# Patient Record
Sex: Male | Born: 1970 | Hispanic: No | Marital: Married | Smoking: Never smoker
Health system: Southern US, Community
[De-identification: ages and names within clinical notes are randomized; demographics above are authoritative.]

## PROBLEM LIST (undated history)

## (undated) DIAGNOSIS — D563 Thalassemia minor: Secondary | ICD-10-CM

## (undated) DIAGNOSIS — Z87442 Personal history of urinary calculi: Secondary | ICD-10-CM

## (undated) DIAGNOSIS — R55 Syncope and collapse: Secondary | ICD-10-CM

---

## 2017-01-03 ENCOUNTER — Encounter: Payer: Self-pay | Admitting: Radiology

## 2017-01-03 ENCOUNTER — Emergency Department
Admission: EM | Admit: 2017-01-03 | Discharge: 2017-01-03 | Disposition: A | Discharge status: Home / Self Care | Payer: 59 | Attending: Emergency Medicine | Admitting: Emergency Medicine

## 2017-01-03 ENCOUNTER — Emergency Department: Payer: 59

## 2017-01-03 ENCOUNTER — Other Ambulatory Visit: Payer: Self-pay

## 2017-01-03 DIAGNOSIS — R1033 Periumbilical pain: Secondary | ICD-10-CM | POA: Insufficient documentation

## 2017-01-03 DIAGNOSIS — N200 Calculus of kidney: Secondary | ICD-10-CM | POA: Insufficient documentation

## 2017-01-03 DIAGNOSIS — R109 Unspecified abdominal pain: Secondary | ICD-10-CM | POA: Diagnosis not present

## 2017-01-03 HISTORY — DX: Thalassemia minor: D56.3

## 2017-01-03 LAB — URINALYSIS, COMPLETE (UACMP) WITH MICROSCOPIC
Bacteria, UA: NONE SEEN
Bilirubin Urine: NEGATIVE
Glucose, UA: NEGATIVE mg/dL
Ketones, ur: NEGATIVE mg/dL
Leukocytes, UA: NEGATIVE
Nitrite: NEGATIVE
Protein, ur: NEGATIVE mg/dL
Specific Gravity, Urine: 1.031 — ABNORMAL HIGH (ref 1.005–1.030)
Squamous Epithelial / LPF: NONE SEEN
pH: 5 (ref 5.0–8.0)

## 2017-01-03 LAB — COMPREHENSIVE METABOLIC PANEL
ALT: 21 U/L (ref 17–63)
AST: 26 U/L (ref 15–41)
Albumin: 4.5 g/dL (ref 3.5–5.0)
Alkaline Phosphatase: 55 U/L (ref 38–126)
Anion gap: 11 (ref 5–15)
BUN: 14 mg/dL (ref 6–20)
CO2: 26 mmol/L (ref 22–32)
Calcium: 9.6 mg/dL (ref 8.9–10.3)
Chloride: 101 mmol/L (ref 101–111)
Creatinine, Ser: 0.84 mg/dL (ref 0.61–1.24)
GFR calc Af Amer: >60 mL/min (ref >60)
GFR calc non Af Amer: >60 mL/min (ref >60)
Glucose, Bld: 110 mg/dL — ABNORMAL HIGH (ref 65–99)
Potassium: 3.7 mmol/L (ref 3.5–5.1)
Sodium: 138 mmol/L (ref 135–145)
Total Bilirubin: 0.9 mg/dL (ref 0.3–1.2)
Total Protein: 7.7 g/dL (ref 6.5–8.1)

## 2017-01-03 LAB — CBC
HCT: 36.2 % — ABNORMAL LOW (ref 40.0–52.0)
Hemoglobin: 11.5 g/dL — ABNORMAL LOW (ref 13.0–18.0)
MCH: 19.8 pg — ABNORMAL LOW (ref 26.0–34.0)
MCHC: 31.8 g/dL — ABNORMAL LOW (ref 32.0–36.0)
MCV: 62.1 fL — ABNORMAL LOW (ref 80.0–100.0)
Platelets: 214 10*3/uL (ref 150–440)
RBC: 5.83 MIL/uL (ref 4.40–5.90)
RDW: 15.6 % — ABNORMAL HIGH (ref 11.5–14.5)
WBC: 8.6 10*3/uL (ref 3.8–10.6)

## 2017-01-03 LAB — LIPASE, BLOOD: Lipase: 28 U/L (ref 11–51)

## 2017-01-03 MED ORDER — KETOROLAC TROMETHAMINE 30 MG/ML IJ SOLN
30.0000 mg | Freq: Once | INTRAMUSCULAR | Status: AC
  Administered 2017-01-03: 30 mg via INTRAVENOUS
  Filled 2017-01-03: qty 1

## 2017-01-03 MED ORDER — ONDANSETRON HCL 4 MG/2ML IJ SOLN
4.0000 mg | Freq: Once | INTRAMUSCULAR | Status: AC
  Administered 2017-01-03: 4 mg via INTRAVENOUS
  Filled 2017-01-03: qty 2

## 2017-01-03 MED ORDER — KETOROLAC TROMETHAMINE 10 MG PO TABS: 10.0000 mg | ORAL_TABLET | Freq: Four times a day (QID) | ORAL | 0 refills | Status: DC | PRN

## 2017-01-03 MED ORDER — IOPAMIDOL (ISOVUE-300) INJECTION 61%
100.0000 mL | Freq: Once | INTRAVENOUS | Status: AC | PRN
  Administered 2017-01-03: 100 mL via INTRAVENOUS

## 2017-01-03 MED ORDER — OXYCODONE-ACETAMINOPHEN 5-325 MG PO TABS: 1.0000 | ORAL_TABLET | Freq: Four times a day (QID) | ORAL | 0 refills | Status: DC | PRN

## 2017-01-03 MED ORDER — SODIUM CHLORIDE 0.9 % IV BOLUS (SEPSIS)
1000.0000 mL | Freq: Once | INTRAVENOUS | Status: AC
Start: 2017-01-03 — End: 2017-01-03
  Administered 2017-01-03: 1000 mL via INTRAVENOUS

## 2017-01-03 MED ORDER — IOPAMIDOL (ISOVUE-300) INJECTION 61%: 30.0000 mL | Freq: Once | INTRAVENOUS | Status: DC

## 2017-01-03 MED ORDER — TAMSULOSIN HCL 0.4 MG PO CAPS: 0.4000 mg | ORAL_CAPSULE | Freq: Every day | ORAL | 0 refills | Status: DC

## 2017-01-03 MED ORDER — MORPHINE SULFATE (PF) 4 MG/ML IV SOLN
4.0000 mg | Freq: Once | INTRAVENOUS | Status: AC
  Administered 2017-01-03: 4 mg via INTRAVENOUS
  Filled 2017-01-03: qty 1

## 2017-01-03 MED ORDER — ONDANSETRON 4 MG PO TBDP: 4.0000 mg | ORAL_TABLET | Freq: Three times a day (TID) | ORAL | 0 refills | Status: DC | PRN

## 2017-01-03 NOTE — ED Triage Notes (Signed)
Patient reports mid abdominal pain that goes into his back that started tonight.  + nausea.

## 2017-01-03 NOTE — ED Notes (Signed)
Patient reports pain that started at approx 2130. Denies any diarrhea or fevers. Had small bowel movement tonight that did not seem to improve pain.

## 2017-01-03 NOTE — Discharge Instructions (Signed)
Your evaluation revealed a right sided kidney stone. Please ensure that your are drinking sufficient amounts of water and please take your medication should the pain return. Please follow up with urology should you still have discomfort as yo may need a lithotripsy if the stone does not pass. Please also return should you develop fever, chills, nausea and vomiting, pain with urination or pain that is uncontrolled by your medication.

## 2017-01-03 NOTE — ED Provider Notes (Signed)
[facility] Emergency Department Provider Note   ____________________________________________   First MD Initiated Contact with Patient 01/03/17 0118     (approximate)  I have reviewed the triage vital signs and the nursing notes.   HISTORY  Chief Complaint Abdominal Pain    HPI Joshua RODGERS is a 46 y.o. male who comes into the hospital today with abdominal pain going to his back.  The patient states that the pain started around 9:00 initially.  It improved but then after he went to sleep but woke him up out of his sleep.  The patient has had some nausea with no vomiting.  He has had some bowel spasms in the past but reports that this is a bit different.  He reports that his pain is more intense and going into his low back.  The patient denies any diarrhea or problems with his urination.  He has had no pain when he urinates or hematuria.  The patient took 2 Tylenol but it did not help with his pain.  He rates his pain a 9 out of 10 in intensity.  The patient came into the hospital for evaluation.    Past Medical History:  Diagnosis Date  . Thalassemia minor     There are no active problems to display for this patient.   History reviewed. No pertinent surgical history.  Prior to Admission medications   Medication Sig Start Date End Date Taking? Authorizing Provider  ketorolac (TORADOL) 10 MG tablet Take 1 tablet (10 mg total) every 6 (six) hours as needed by mouth. 01/03/17   Loney Hering, MD  ondansetron (ZOFRAN ODT) 4 MG disintegrating tablet Take 1 tablet (4 mg total) every 8 (eight) hours as needed by mouth for nausea or vomiting. 01/03/17   Loney Hering, MD  oxyCODONE-acetaminophen (ROXICET) 5-325 MG tablet Take 1 tablet every 6 (six) hours as needed by mouth. 01/03/17   Loney Hering, MD  tamsulosin [facility]) 0.4 MG CAPS capsule Take 1 capsule (0.4 mg total) daily by mouth. 01/03/17   Loney Hering, MD     Allergies Patient has no known allergies.  No family history on file.  Social History Social History   Tobacco Use  . Smoking status: Never Smoker  Substance Use Topics  . Alcohol use: No    Frequency: Never  . Drug use: Not on file    Review of Systems  Constitutional: No fever/chills Eyes: No visual changes. ENT: No sore throat. Cardiovascular: Denies chest pain. Respiratory: Denies shortness of breath. Gastrointestinal: abdominal pain.   nausea, no vomiting.  No diarrhea.  No constipation. Genitourinary: Negative for dysuria. Musculoskeletal:  back pain. Skin: Negative for rash. Neurological: Negative for headaches, focal weakness or numbness.   ____________________________________________   PHYSICAL EXAM:  VITAL SIGNS: ED Triage Vitals  Enc Vitals Group     BP 01/03/17 0116 140/78     Pulse Rate 01/03/17 0116 61     Resp 01/03/17 0116 20     Temp 01/03/17 0116 97.7 F (36.5 C)     Temp Source 01/03/17 0116 Oral     SpO2 01/03/17 0116 99 %     Weight 01/03/17 0115 192 lb (87.1 kg)     Height 01/03/17 0115 6' (1.829 m)     Head Circumference --      Peak Flow --      Pain Score 01/03/17 0114 9     Pain Loc --  Pain Edu? --      Excl. in [place]? --     Constitutional: Alert and oriented. Well appearing and in moderate distress. Eyes: Conjunctivae are normal. PERRL. EOMI. Head: Atraumatic. Nose: No congestion/rhinnorhea. Mouth/Throat: Mucous membranes are moist.  Oropharynx non-erythematous. Cardiovascular: Normal rate, regular rhythm. Grossly normal heart sounds.  Good peripheral circulation. Respiratory: Normal respiratory effort.  No retractions. Lungs CTAB. Gastrointestinal: Soft with some periumbilical and suprapubic tenderness to palpation, no right upper quadrant tenderness to palpation. No distention.  Positive bowel sounds No CVA tenderness to palpation Musculoskeletal: No lower extremity tenderness nor edema.   Neurologic:  Normal speech  and language.  Skin:  Skin is warm, dry and intact.  Psychiatric: Mood and affect are normal.   ____________________________________________   LABS (all labs ordered are listed, but only abnormal results are displayed)  Labs Reviewed  CBC - Abnormal; Notable for the following components:      Result Value   Hemoglobin 11.5 (*)    HCT 36.2 (*)    MCV 62.1 (*)    MCH 19.8 (*)    MCHC 31.8 (*)    RDW 15.6 (*)    All other components within normal limits  COMPREHENSIVE METABOLIC PANEL - Abnormal; Notable for the following components:   Glucose, Bld 110 (*)    All other components within normal limits  URINALYSIS, COMPLETE (UACMP) WITH MICROSCOPIC - Abnormal; Notable for the following components:   Color, Urine STRAW (*)    APPearance CLEAR (*)    Specific Gravity, Urine 1.031 (*)    Hgb urine dipstick MODERATE (*)    All other components within normal limits  LIPASE, BLOOD   ____________________________________________  EKG  none ____________________________________________  RADIOLOGY  Ct Abdomen Pelvis W Contrast  Result Date: 01/03/2017 CLINICAL DATA:  Acute onset of mid abdominal pain, radiating to the back. Nausea. EXAM: CT ABDOMEN AND PELVIS WITH CONTRAST TECHNIQUE: Multidetector CT imaging of the abdomen and pelvis was performed using the standard protocol following bolus administration of intravenous contrast. CONTRAST:  142mL ISOVUE-300 IOPAMIDOL (ISOVUE-300) INJECTION 61% COMPARISON:  None. FINDINGS: Lower chest: Minimal nodular atelectasis is noted at the right lung base. The visualized portions of the mediastinum are unremarkable. Hepatobiliary: The liver is unremarkable in appearance. The gallbladder is unremarkable in appearance. The common bile duct remains normal in caliber. Pancreas: The pancreas is within normal limits. Spleen: The spleen is unremarkable in appearance. Adrenals/Urinary Tract: There is slightly decreased enhancement of the right kidney, with  minimal right-sided hydronephrosis. This reflects an obstructing 5 x 4 mm stone at the proximal right ureter, just below the right ureteropelvic junction. The left kidney is unremarkable in appearance. No nonobstructing renal stones are identified. Stomach/Bowel: The stomach is unremarkable in appearance. The small bowel is within normal limits. The appendix is normal in caliber, without evidence of appendicitis. The colon is unremarkable in appearance. Vascular/Lymphatic: The abdominal aorta is unremarkable in appearance. The inferior vena cava is grossly unremarkable. No retroperitoneal lymphadenopathy is seen. No pelvic sidewall lymphadenopathy is identified. Reproductive: The bladder is mildly distended and grossly unremarkable. The prostate is enlarged, measuring 5.3 cm in transverse dimension. Other: No additional soft tissue abnormalities are seen. Musculoskeletal: No acute osseous abnormalities are identified. The visualized musculature is unremarkable in appearance. IMPRESSION: 1. Minimal right-sided hydronephrosis, with an obstructing 5 x 4 mm stone at the proximal right ureter, just below the right ureteropelvic junction. 2. Enlarged prostate noted. Electronically Signed   By: Francoise Schaumann.D.  On: 01/03/2017 02:38    ____________________________________________   PROCEDURES  Procedure(s) performed: None  Procedures  Critical Care performed: No  ____________________________________________   INITIAL IMPRESSION / ASSESSMENT AND PLAN / ED COURSE  As part of my medical decision making, I reviewed the following data within the electronic MEDICAL RECORD NUMBER Notes from prior ED visits and [place] Controlled Substance Database   This is a 46 year old male who comes into the hospital today with some mid abdominal pain that goes into his back.  My differential diagnosis includes pancreatitis, gastritis, biliary disease, urinary tract infection, appendicitis  I did check some blood work and  the patient's blood work was unremarkable.  His hemoglobin is appropriate for his thalassemia minor.  I sent the patient also for a CT scan to evaluate the multiple causes of the patient's pain.  He did receive a dose of morphine as well as some Zofran.  The patient CT scan resulted with some minimal right-sided hydronephrosis and an obstructing 5 x 4 mm stone at the proximal right UPJ.  The patient's prostate was also found to be enlarged.  After the pain medication the patient was still uncomfortable so he received a dose of Toradol.  He also received a liter of normal saline and his pain improved significantly.  The patient's urine does not show any signs of infection.  After the medication was received the patient decided that he was ready to be discharged home.  The patient should follow back up with urology for further evaluation and possible lithotripsy.  The patient will be discharged home.  He had no further questions.      ____________________________________________   FINAL CLINICAL IMPRESSION(S) / ED DIAGNOSES  Final diagnoses:  Periumbilical abdominal pain  Kidney stone     ED Discharge Orders        Ordered    ketorolac (TORADOL) 10 MG tablet  Every 6 hours PRN     01/03/17 0322    oxyCODONE-acetaminophen (ROXICET) 5-325 MG tablet  Every 6 hours PRN     01/03/17 0322    tamsulosin (FLOMAX) 0.4 MG CAPS capsule  Daily     01/03/17 0322    ondansetron (ZOFRAN ODT) 4 MG disintegrating tablet  Every 8 hours PRN     01/03/17 0322       Note:  This document was prepared using Dragon voice recognition software and may include unintentional dictation errors.    Loney Hering, MD 01/03/17 [phone]

## 2017-01-04 ENCOUNTER — Ambulatory Visit
Admission: RE | Admit: 2017-01-04 | Discharge: 2017-01-04 | Disposition: A | Discharge status: Home / Self Care | Payer: 59 | Source: Ambulatory Visit | Attending: Urology | Admitting: Urology

## 2017-01-04 ENCOUNTER — Other Ambulatory Visit: Payer: Self-pay | Admitting: Radiology

## 2017-01-04 ENCOUNTER — Encounter: Payer: Self-pay | Admitting: Urology

## 2017-01-04 ENCOUNTER — Ambulatory Visit (INDEPENDENT_AMBULATORY_CARE_PROVIDER_SITE_OTHER): Payer: 59 | Admitting: Urology

## 2017-01-04 VITALS — BP 128/82 | HR 69 | Ht 72.0 in | Wt 200.6 lb

## 2017-01-04 DIAGNOSIS — N23 Unspecified renal colic: Secondary | ICD-10-CM | POA: Diagnosis not present

## 2017-01-04 DIAGNOSIS — N201 Calculus of ureter: Secondary | ICD-10-CM

## 2017-01-04 DIAGNOSIS — N200 Calculus of kidney: Secondary | ICD-10-CM | POA: Diagnosis not present

## 2017-01-04 LAB — URINALYSIS, COMPLETE
Bilirubin, UA: NEGATIVE
Glucose, UA: NEGATIVE
Ketones, UA: NEGATIVE
Leukocytes, UA: NEGATIVE
Nitrite, UA: NEGATIVE
Protein, UA: NEGATIVE
Specific Gravity, UA: 1.025 (ref 1.005–1.030)
Urobilinogen, Ur: 0.2 mg/dL (ref 0.2–1.0)
pH, UA: 5.5 (ref 5.0–7.5)

## 2017-01-04 LAB — MICROSCOPIC EXAMINATION
Bacteria, UA: NONE SEEN
Epithelial Cells (non renal): NONE SEEN /hpf (ref 0–10)

## 2017-01-04 MED ORDER — HYDROMORPHONE HCL 2 MG PO TABS: 2.0000 mg | ORAL_TABLET | ORAL | 0 refills | Status: DC | PRN

## 2017-01-04 MED ORDER — CIPROFLOXACIN HCL 500 MG PO TABS
500.0000 mg | ORAL_TABLET | ORAL | Status: AC
  Administered 2017-01-05: 500 mg via ORAL

## 2017-01-04 NOTE — Progress Notes (Signed)
01/04/2017 11:50 AM   ZEALAND BOYETT 02/11/1971 [id number]   Chief Complaint  Patient presents with  . Nephrolithiasis    HPI: Duval Macleod is a 46 year-old male who presents for evaluation of a right ureteral calculus.  He presented to the [facility] ED in the early morning hours of 11/13 with a 6-hour history of intermittent right flank pain radiating to the right mid abdomen.  He awoke with significant pain without identifiable precipitating, aggravating or alleviating factors.  He had nausea without vomiting.  He denied fever, chills.  A stone protocol CT of the abdomen and pelvis was performed which demonstrated a 5 mm right proximal ureteral calculus with mild hydronephrosis/hydroureter.  He received parenteral ketorolac with only mild improvement in his pain however had resolution with IV narcotic analgesics.  He was discharged on oxycodone and tamsulosin.  He had minimal pain yesterday and was able to work.  His pain started again this morning 0300 and he currently rates it at moderate.  He has taken oxycodone with only mild improvement. He denies prior history of stone disease or other urologic problems.  PMH: Past Medical History:  Diagnosis Date  . Thalassemia minor     Surgical History: History reviewed. No pertinent surgical history.  Home Medications:  Allergies as of 01/04/2017   No Known Allergies     Medication List        Accurate as of 01/04/17 11:50 AM. Always use your most recent med list.          HYDROmorphone 2 MG tablet Commonly known as:  DILAUDID Take 1 tablet (2 mg total) every 4 (four) hours as needed by mouth for severe pain.   oxyCODONE-acetaminophen 5-325 MG tablet Commonly known as:  ROXICET Take 1 tablet every 6 (six) hours as needed by mouth.   tamsulosin 0.4 MG Caps capsule Commonly known as:  FLOMAX Take 1 capsule (0.4 mg total) daily by mouth.       Allergies: No Known Allergies  Family History: History reviewed. No  pertinent family history.  Social History:  reports that  has never smoked. He does not have any smokeless tobacco history on file. He reports that he does not drink alcohol. His drug history is not on file.  ROS: UROLOGY Frequent Urination?: No Hard to postpone urination?: No Burning/pain with urination?: No Get up at night to urinate?: No Leakage of urine?: No Urine stream starts and stops?: Yes Trouble starting stream?: No Do you have to strain to urinate?: No Blood in urine?: No Urinary tract infection?: No Sexually transmitted disease?: No Injury to kidneys or bladder?: No Painful intercourse?: No Weak stream?: No Erection problems?: No Penile pain?: No  Gastrointestinal Nausea?: Yes Vomiting?: No Indigestion/heartburn?: No Diarrhea?: No Constipation?: No  Constitutional Fever: No Night sweats?: No Weight loss?: No Fatigue?: Yes  Skin Skin rash/lesions?: No Itching?: Yes  Eyes Blurred vision?: No Double vision?: No  Ears/Nose/Throat Sore throat?: No Sinus problems?: No  Hematologic/Lymphatic Swollen glands?: No Easy bruising?: No  Cardiovascular Leg swelling?: No Chest pain?: No  Respiratory Cough?: No Shortness of breath?: No  Endocrine Excessive thirst?: No  Musculoskeletal Back pain?: Yes Joint pain?: No  Neurological Headaches?: No Dizziness?: No  Psychologic Depression?: No Anxiety?: No  Physical Exam: BP 128/82 (BP Location: Right Arm, Patient Position: Sitting, Cuff Size: Large)   Pulse 69   Ht 6' (1.829 m)   Wt 200 lb 9.6 oz (91 kg)   BMI 27.21 kg/m   Constitutional:  Alert and oriented, No acute distress. HEENT: [place] AT, moist mucus membranes.  Trachea midline, no masses. Cardiovascular: No clubbing, cyanosis, or edema. RRR Respiratory: Normal respiratory effort, no increased work of breathing.  Lungs clear GI: Abdomen is soft, nontender, nondistended, no abdominal masses GU: No CVA tenderness.  Skin: No rashes,  bruises or suspicious lesions. Lymph: No cervical or inguinal adenopathy. Neurologic: Grossly intact, no focal deficits, moving all 4 extremities. Psychiatric: Normal mood and affect.  Laboratory Data: Lab Results  Component Value Date   WBC 8.6 01/03/2017   HGB 11.5 (L) 01/03/2017   HCT 36.2 (L) 01/03/2017   MCV 62.1 (L) 01/03/2017   PLT 214 01/03/2017    Lab Results  Component Value Date   CREATININE 0.84 01/03/2017    Urinalysis Lab Results  Component Value Date   APPEARANCEUR CLEAR (A) 01/03/2017   LEUKOCYTESUR NEGATIVE 01/03/2017   PROTEINUR NEGATIVE 01/03/2017   GLUCOSEU NEGATIVE 01/03/2017   RBCU 6-30 01/03/2017   BILIRUBINUR NEGATIVE 01/03/2017   NITRITE NEGATIVE 01/03/2017    Lab Results  Component Value Date   BACTERIA NONE SEEN 01/03/2017    Pertinent Imaging:  CT personally reviewed.  CLINICAL DATA:  Acute onset of mid abdominal pain, radiating to the back. Nausea.  EXAM: CT ABDOMEN AND PELVIS WITH CONTRAST  TECHNIQUE: Multidetector CT imaging of the abdomen and pelvis was performed using the standard protocol following bolus administration of intravenous contrast.  CONTRAST:  134mL ISOVUE-300 IOPAMIDOL (ISOVUE-300) INJECTION 61%  COMPARISON:  None.  FINDINGS: Lower chest: Minimal nodular atelectasis is noted at the right lung base. The visualized portions of the mediastinum are unremarkable.  Hepatobiliary: The liver is unremarkable in appearance. The gallbladder is unremarkable in appearance. The common bile duct remains normal in caliber.  Pancreas: The pancreas is within normal limits.  Spleen: The spleen is unremarkable in appearance.  Adrenals/Urinary Tract: There is slightly decreased enhancement of the right kidney, with minimal right-sided hydronephrosis. This reflects an obstructing 5 x 4 mm stone at the proximal right ureter, just below the right ureteropelvic junction.  The left kidney is unremarkable in  appearance. No nonobstructing renal stones are identified.  Stomach/Bowel: The stomach is unremarkable in appearance. The small bowel is within normal limits. The appendix is normal in caliber, without evidence of appendicitis. The colon is unremarkable in appearance.  Vascular/Lymphatic: The abdominal aorta is unremarkable in appearance. The inferior vena cava is grossly unremarkable. No retroperitoneal lymphadenopathy is seen. No pelvic sidewall lymphadenopathy is identified.  Reproductive: The bladder is mildly distended and grossly unremarkable. The prostate is enlarged, measuring 5.3 cm in transverse dimension.  Other: No additional soft tissue abnormalities are seen.  Musculoskeletal: No acute osseous abnormalities are identified. The visualized musculature is unremarkable in appearance.  IMPRESSION: 1. Minimal right-sided hydronephrosis, with an obstructing 5 x 4 mm stone at the proximal right ureter, just below the right ureteropelvic junction. 2. Enlarged prostate noted.   Electronically Signed   By: Garald Balding M.D.   On: 01/03/2017 02:38   Assessment & Plan:    1. Ureteral calculus Symptomatic right proximal ureteral calculus.  A KUB was obtained and the stone is easily visualized adjacent to the right L4 transverse process.  Density measurements on CT are 700-850 HU.  The calculus is greater than 1 cm below the right renal outline.  I discussed management options including a continued brief trial of passage, shockwave lithotripsy and ureteroscopic removal.  The pros and cons of each treatment were discussed.  He  has elected to proceed with shockwave lithotripsy and will schedule 11/14.  The indications and nature of the planned procedure were discussed as well as the potential benefits and expected outcome.  Alternatives were discussed as described above.  The most common complications and side effects were discussed as outlined in the [facility] consent form.  It was stressed that there is no guarantee that lithotripsy will be successful and he could require retreatment or alternative treatment.  The rare instance of perirenal bleeding requiring hospitalization, transfusion and rarely surgery were discussed.  The possibility of renal colic from obstructing stone fragments requiring stent placement or ureteroscopy was also discussed.  He indicated all questions were answered to his satisfaction and desires to proceed.  Pain control today has not been adequate with oxycodone and he was given an Rx of hydromorphone.   - Urinalysis, Complete - Abdomen 1 view (KUB); Future    Abbie Sons, [place] [address] [place], [place] [address] [phone]

## 2017-01-05 ENCOUNTER — Ambulatory Visit
Admission: RE | Admit: 2017-01-05 | Discharge: 2017-01-05 | Disposition: A | Discharge status: Home / Self Care | Payer: 59 | Source: Ambulatory Visit | Attending: Urology | Admitting: Urology

## 2017-01-05 ENCOUNTER — Ambulatory Visit: Payer: 59

## 2017-01-05 ENCOUNTER — Encounter
Admission: RE | Disposition: A | Discharge status: Home / Self Care | Payer: Self-pay | Source: Ambulatory Visit | Attending: Urology

## 2017-01-05 ENCOUNTER — Encounter: Payer: Self-pay | Admitting: *Deleted

## 2017-01-05 DIAGNOSIS — N200 Calculus of kidney: Secondary | ICD-10-CM | POA: Diagnosis not present

## 2017-01-05 DIAGNOSIS — N201 Calculus of ureter: Secondary | ICD-10-CM

## 2017-01-05 DIAGNOSIS — D563 Thalassemia minor: Secondary | ICD-10-CM | POA: Diagnosis not present

## 2017-01-05 DIAGNOSIS — Z79899 Other long term (current) drug therapy: Secondary | ICD-10-CM | POA: Diagnosis not present

## 2017-01-05 HISTORY — PX: EXTRACORPOREAL SHOCK WAVE LITHOTRIPSY: SHX1557

## 2017-01-05 SURGERY — LITHOTRIPSY, ESWL
Anesthesia: Moderate Sedation | Laterality: Right

## 2017-01-05 MED ORDER — SODIUM CHLORIDE 0.9 % IV SOLN
INTRAVENOUS | Status: DC
  Administered 2017-01-05: 100 mL/h via INTRAVENOUS

## 2017-01-05 MED ORDER — OXYCODONE-ACETAMINOPHEN 5-325 MG PO TABS: 1.0000 | ORAL_TABLET | Freq: Four times a day (QID) | ORAL | 0 refills | Status: DC | PRN

## 2017-01-05 MED ORDER — DIPHENHYDRAMINE HCL 25 MG PO CAPS
ORAL_CAPSULE | ORAL | Status: AC
  Administered 2017-01-05: 25 mg via ORAL
  Filled 2017-01-05: qty 1

## 2017-01-05 MED ORDER — ONDANSETRON HCL 4 MG/2ML IJ SOLN
4.0000 mg | Freq: Once | INTRAMUSCULAR | Status: AC
  Administered 2017-01-05: 4 mg via INTRAVENOUS

## 2017-01-05 MED ORDER — CIPROFLOXACIN HCL 500 MG PO TABS
ORAL_TABLET | ORAL | Status: AC
  Administered 2017-01-05: 500 mg via ORAL
  Filled 2017-01-05: qty 1

## 2017-01-05 MED ORDER — DIPHENHYDRAMINE HCL 25 MG PO CAPS
25.0000 mg | ORAL_CAPSULE | ORAL | Status: AC
  Administered 2017-01-05: 25 mg via ORAL

## 2017-01-05 MED ORDER — ONDANSETRON HCL 4 MG/2ML IJ SOLN
INTRAMUSCULAR | Status: AC
  Administered 2017-01-05: 4 mg via INTRAVENOUS
  Filled 2017-01-05: qty 2

## 2017-01-05 MED ORDER — DIAZEPAM 5 MG PO TABS
ORAL_TABLET | ORAL | Status: AC
  Administered 2017-01-05: 10 mg via ORAL
  Filled 2017-01-05: qty 2

## 2017-01-05 MED ORDER — DIAZEPAM 5 MG PO TABS
10.0000 mg | ORAL_TABLET | ORAL | Status: AC
  Administered 2017-01-05: 10 mg via ORAL

## 2017-01-05 NOTE — Discharge Instructions (Signed)
See [facility] discharge instructions in chart.  AMBULATORY SURGERY  DISCHARGE INSTRUCTIONS   1) The drugs that you were given will stay in your system until tomorrow so for the next 24 hours you should not:  A) Drive an automobile B) Make any legal decisions C) Drink any alcoholic beverage   2) You may resume regular meals tomorrow.  Today it is better to start with liquids and gradually work up to solid foods.  You may eat anything you prefer, but it is better to start with liquids, then soup and crackers, and gradually work up to solid foods.   3) Please notify your doctor immediately if you have any unusual bleeding, trouble breathing, redness and pain at the surgery site, drainage, fever, or pain not relieved by medication.    4) Additional Instructions:        Please contact your physician with any problems or Same Day Surgery at [phone], Monday through Friday 6 am to 4 pm, or [facility] Main number at [phone].  

## 2017-01-06 ENCOUNTER — Encounter: Payer: Self-pay | Admitting: Urology

## 2017-01-19 ENCOUNTER — Ambulatory Visit
Admission: RE | Admit: 2017-01-19 | Discharge: 2017-01-19 | Disposition: A | Discharge status: Home / Self Care | Payer: 59 | Source: Ambulatory Visit | Attending: Urology | Admitting: Urology

## 2017-01-19 ENCOUNTER — Encounter: Payer: Self-pay | Admitting: Urology

## 2017-01-19 ENCOUNTER — Ambulatory Visit (INDEPENDENT_AMBULATORY_CARE_PROVIDER_SITE_OTHER): Payer: 59 | Admitting: Urology

## 2017-01-19 VITALS — BP 109/65 | HR 75 | Ht 73.0 in | Wt 198.0 lb

## 2017-01-19 DIAGNOSIS — N200 Calculus of kidney: Secondary | ICD-10-CM

## 2017-01-19 DIAGNOSIS — N201 Calculus of ureter: Secondary | ICD-10-CM | POA: Diagnosis not present

## 2017-01-19 NOTE — Progress Notes (Signed)
01/19/2017 1:27 PM   Joshua Stuart [birth year] [id number]  Referring provider: No referring provider defined for this encounter.  Chief Complaint  Patient presents with  . Follow-up    HPI: 46 year old male seen on 11/14 with a right proximal ureteral calculus and renal colic.  He underwent shockwave lithotripsy by Dr. Erlene Quan on 11/15 and the day of procedure his stone had migrated back to the kidney.  He has been asymptomatic since his treatment.  He passed 3 small fragments which he was unable to retrieve.  He is asymptomatic.   PMH: Past Medical History:  Diagnosis Date  . Thalassemia minor     Surgical History: Past Surgical History:  Procedure Laterality Date  . EXTRACORPOREAL SHOCK WAVE LITHOTRIPSY Right 01/05/2017   Procedure: EXTRACORPOREAL SHOCK WAVE LITHOTRIPSY (ESWL);  Surgeon: Hollice Espy, MD;  Location: [facility] ORS;  Service: Urology;  Laterality: Right;    Home Medications:  Allergies as of 01/19/2017   No Known Allergies     Medication List    as of 01/19/2017  1:27 PM   You have not been prescribed any medications.     Allergies: No Known Allergies  Family History: No family history on file.  Social History:  reports that  has never smoked. he has never used smokeless tobacco. He reports that he does not drink alcohol or use drugs.  ROS: UROLOGY Frequent Urination?: No Hard to postpone urination?: No Burning/pain with urination?: No Get up at night to urinate?: No Leakage of urine?: No Urine stream starts and stops?: No Trouble starting stream?: No Do you have to strain to urinate?: No Blood in urine?: No Urinary tract infection?: No Sexually transmitted disease?: No Injury to kidneys or bladder?: No Painful intercourse?: No Weak stream?: No Erection problems?: No Penile pain?: No  Gastrointestinal Nausea?: No Vomiting?: No Indigestion/heartburn?: No Diarrhea?: No Constipation?: No  Constitutional Fever: No Night  sweats?: No Weight loss?: No Fatigue?: No  Skin Skin rash/lesions?: No Itching?: No  Eyes Blurred vision?: No Double vision?: No  Ears/Nose/Throat Sore throat?: No Sinus problems?: No  Hematologic/Lymphatic Swollen glands?: No Easy bruising?: No  Cardiovascular Leg swelling?: No Chest pain?: No  Respiratory Cough?: No Shortness of breath?: No  Endocrine Excessive thirst?: No  Musculoskeletal Back pain?: No Joint pain?: No  Neurological Headaches?: No Dizziness?: No  Psychologic Depression?: No Anxiety?: No  Physical Exam: BP 109/65   Pulse 75   Ht 6\' 1"  (1.854 m)   Wt 198 lb (89.8 kg)   BMI 26.12 kg/m   Constitutional:  Alert and oriented, No acute distress. HEENT: [place] AT, moist mucus membranes.  Trachea midline, no masses. Cardiovascular: No clubbing, cyanosis, or edema. Respiratory: Normal respiratory effort, no increased work of breathing. GI: Abdomen is soft, nontender, nondistended, no abdominal masses GU: No CVA tenderness.  Skin: No rashes, bruises or suspicious lesions. Lymph: No cervical or inguinal adenopathy. Neurologic: Grossly intact, no focal deficits, moving all 4 extremities. Psychiatric: Normal mood and affect.  Laboratory Data: Lab Results  Component Value Date   WBC 8.6 01/03/2017   HGB 11.5 (L) 01/03/2017   HCT 36.2 (L) 01/03/2017   MCV 62.1 (L) 01/03/2017   PLT 214 01/03/2017    Lab Results  Component Value Date   CREATININE 0.84 01/03/2017    Urinalysis  Pertinent Imaging: KUB performed today was reviewed and there is a calcific density overlying the right L5 transverse process which may represent a persistent calculus.   Results for orders placed  during the hospital encounter of 01/19/17  DG Abd 1 View   Narrative CLINICAL DATA:  46 year old male with a history of right-sided kidney stone  EXAM: ABDOMEN - 1 VIEW  COMPARISON:  Plain film 01/05/2017, 01/04/2017, CT 01/03/2017  FINDINGS: Gas within  stomach, small bowel, colon.  Pelvic phleboliths.  Rounded density projecting over the right transverse process of L5 may reflect ureteral calculus. No calcific density identified projecting over the region of the right renal silhouette.  No left sided calcifications.  No displaced fracture.  IMPRESSION: Rounded density projecting over the right transverse process of L5 may reflect ureteral calculus.  The calcification identified in the region of the right renal silhouette on prior plain film is not visualized.   Electronically Signed   By: Corrie Mckusick D.O.   On: 01/19/2017 08:47      Assessment & Plan:   1. Nephrolithiasis Status post shockwave lithotripsy.  He is asymptomatic.  Schedule a follow-up KUB and renal ultrasound in 3 weeks.  He was instructed to call earlier for development of right flank pain.  - Abdomen 1 view (KUB); Future - Ultrasound renal complete; Future    Abbie Sons, [place] [address] [place], [place] [address] [phone]

## 2017-01-27 ENCOUNTER — Ambulatory Visit
Admission: RE | Admit: 2017-01-27 | Discharge: 2017-01-27 | Disposition: A | Discharge status: Home / Self Care | Payer: 59 | Source: Ambulatory Visit | Attending: Urology | Admitting: Urology

## 2017-01-27 DIAGNOSIS — N200 Calculus of kidney: Secondary | ICD-10-CM | POA: Diagnosis not present

## 2017-02-03 ENCOUNTER — Telehealth: Payer: Self-pay

## 2017-02-03 DIAGNOSIS — N200 Calculus of kidney: Secondary | ICD-10-CM

## 2017-02-03 NOTE — Telephone Encounter (Signed)
-----   Message from Abbie Sons, MD sent at 02/01/2017 12:37 PM EST ----- Please let Dr. Fletcher Anon know his renal ultrasound showed no calculi or hydronephrosis.  I would recommend obtaining a follow-up KUB to recheck the calcification that was seen on KUB at his last visit.

## 2017-02-03 NOTE — Telephone Encounter (Signed)
Spoke with pt in reference to RUS results and getting KUB. Pt voiced understanding. Orders placed.

## 2017-02-21 DIAGNOSIS — N200 Calculus of kidney: Secondary | ICD-10-CM

## 2017-02-21 HISTORY — DX: Calculus of kidney: N20.0

## 2017-04-27 DIAGNOSIS — Z113 Encounter for screening for infections with a predominantly sexual mode of transmission: Secondary | ICD-10-CM | POA: Diagnosis not present

## 2017-05-27 DIAGNOSIS — Z3189 Encounter for other procreative management: Secondary | ICD-10-CM | POA: Diagnosis not present

## 2018-09-28 IMAGING — CR DG ABDOMEN 1V
1 series · 2 of 2 positions shown · non-contrast
Comparison: 01/04/2017.

CLINICAL DATA: RIGHT-sided kidney stone.

EXAM:
ABDOMEN - 1 VIEW

[Series 1: dg abd 1 view · 0.14mm/px · 2 of 2 slices shown]
[im 1/2]
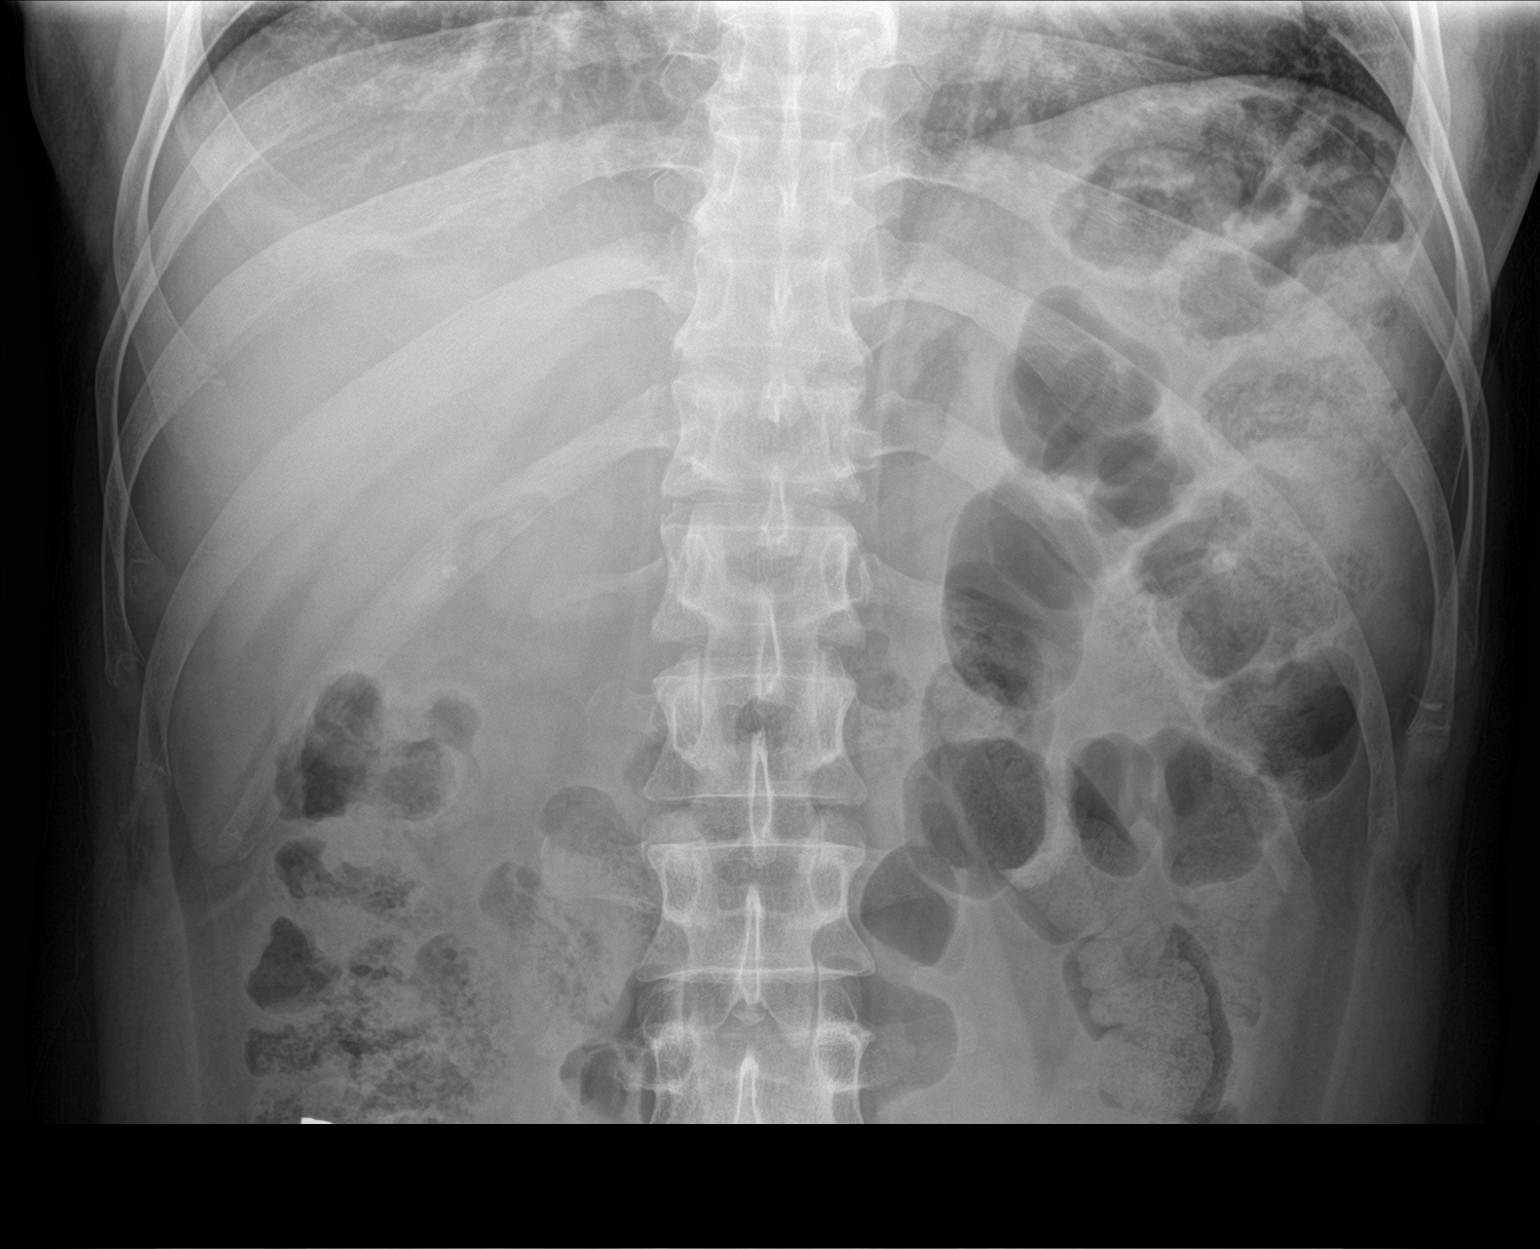
[im 2/2]
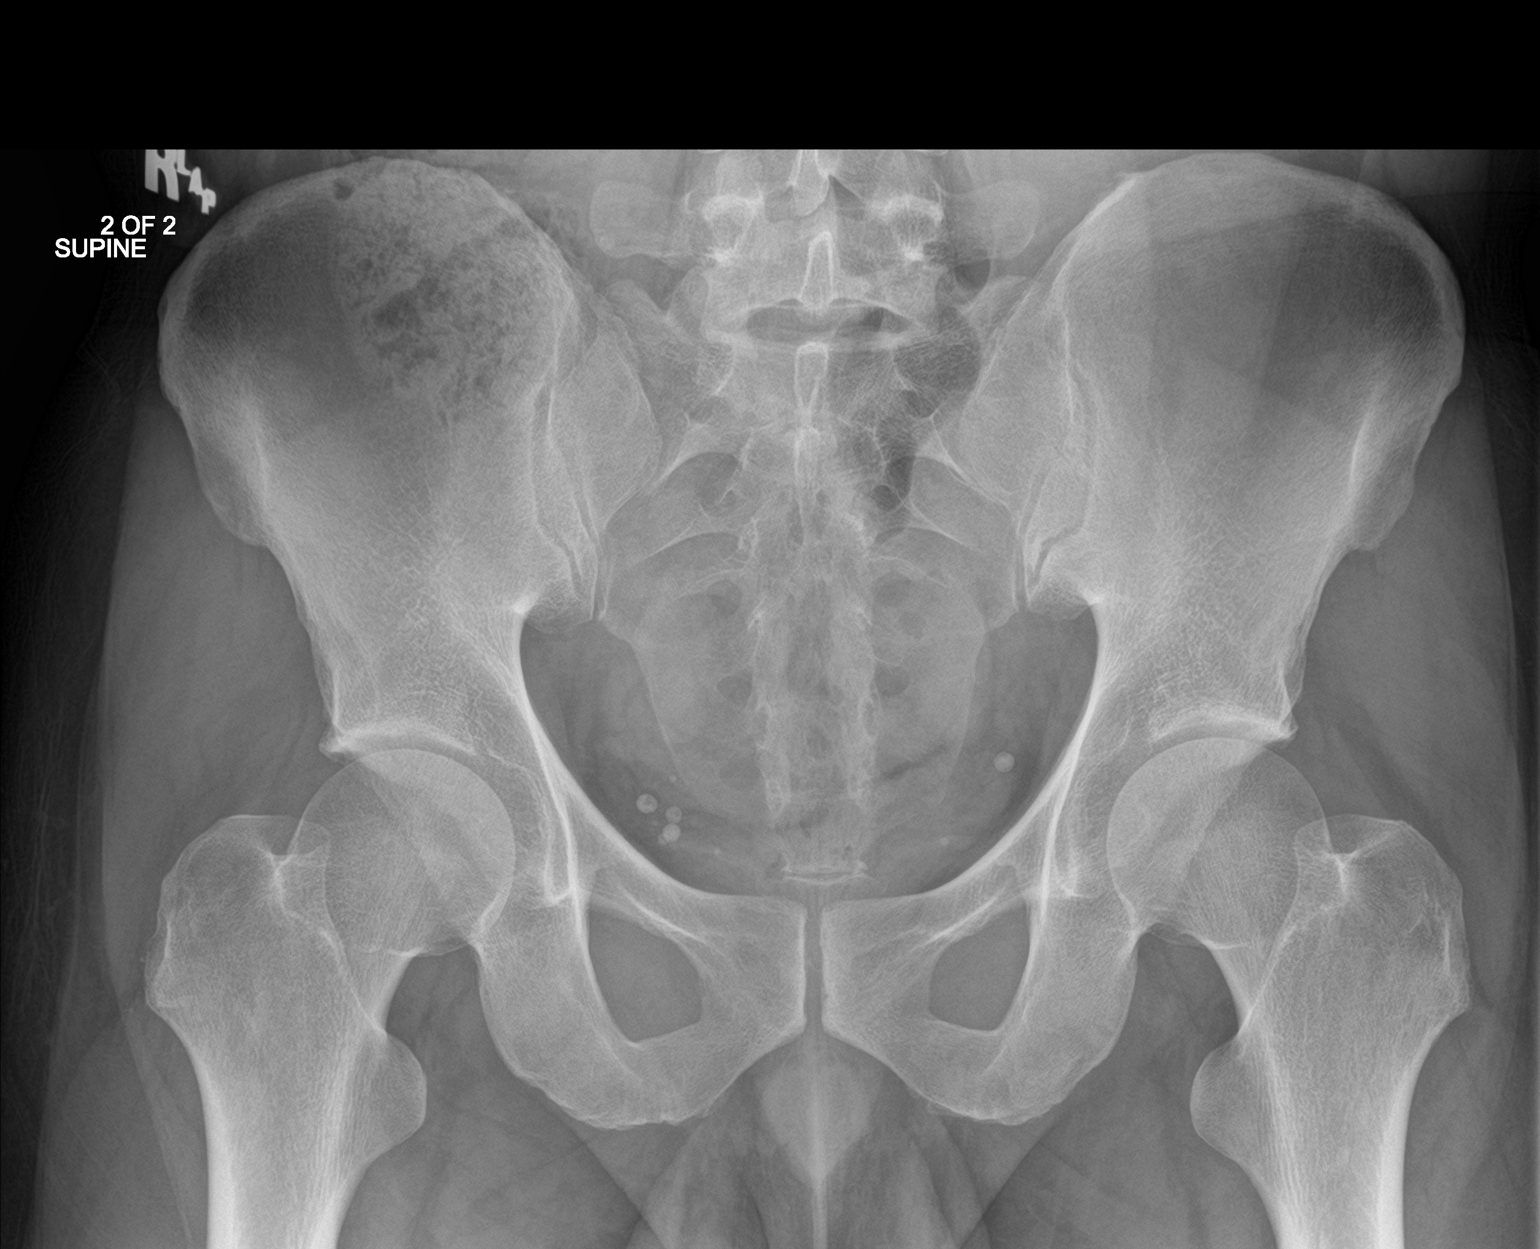

[2 of 2 positions shown; findings below may reference images not displayed]

FINDINGS: The 4 mm calculus observed on yesterday's radiograph which was to
the RIGHT of the L3 vertebral body may have now migrated back into
the renal collecting system. It is no longer seen in its previous
location. Moderate stool burden.
IMPRESSION: Query calculus migration back into the RIGHT renal collecting
system.

## 2019-03-21 DIAGNOSIS — H5213 Myopia, bilateral: Secondary | ICD-10-CM | POA: Diagnosis not present

## 2019-04-28 ENCOUNTER — Other Ambulatory Visit: Payer: Self-pay

## 2019-04-28 ENCOUNTER — Other Ambulatory Visit: Payer: Self-pay | Admitting: Internal Medicine

## 2019-04-28 ENCOUNTER — Emergency Department: Payer: 59

## 2019-04-28 ENCOUNTER — Emergency Department
Admission: EM | Admit: 2019-04-28 | Discharge: 2019-04-28 | Disposition: A | Discharge status: Home / Self Care | Payer: 59 | Attending: Emergency Medicine | Admitting: Emergency Medicine

## 2019-04-28 DIAGNOSIS — R0789 Other chest pain: Secondary | ICD-10-CM | POA: Diagnosis not present

## 2019-04-28 DIAGNOSIS — Z79899 Other long term (current) drug therapy: Secondary | ICD-10-CM | POA: Diagnosis not present

## 2019-04-28 DIAGNOSIS — R079 Chest pain, unspecified: Secondary | ICD-10-CM

## 2019-04-28 DIAGNOSIS — R55 Syncope and collapse: Secondary | ICD-10-CM

## 2019-04-28 DIAGNOSIS — R072 Precordial pain: Secondary | ICD-10-CM | POA: Diagnosis not present

## 2019-04-28 DIAGNOSIS — R42 Dizziness and giddiness: Secondary | ICD-10-CM | POA: Diagnosis not present

## 2019-04-28 HISTORY — DX: Syncope and collapse: R55

## 2019-04-28 LAB — CBC WITH DIFFERENTIAL/PLATELET
Abs Immature Granulocytes: 0.03 10*3/uL (ref 0.00–0.07)
Basophils Absolute: 0 10*3/uL (ref 0.0–0.1)
Basophils Relative: 0 %
Eosinophils Absolute: 0.1 10*3/uL (ref 0.0–0.5)
Eosinophils Relative: 2 %
HCT: 35.2 % — ABNORMAL LOW (ref 39.0–52.0)
Hemoglobin: 11 g/dL — ABNORMAL LOW (ref 13.0–17.0)
Immature Granulocytes: 1 %
Lymphocytes Relative: 40 %
Lymphs Abs: 2.6 10*3/uL (ref 0.7–4.0)
MCH: 19.9 pg — ABNORMAL LOW (ref 26.0–34.0)
MCHC: 31.3 g/dL (ref 30.0–36.0)
MCV: 63.7 fL — ABNORMAL LOW (ref 80.0–100.0)
Monocytes Absolute: 0.4 10*3/uL (ref 0.1–1.0)
Monocytes Relative: 6 %
Neutro Abs: 3.4 10*3/uL (ref 1.7–7.7)
Neutrophils Relative %: 51 %
Platelets: 175 10*3/uL (ref 150–400)
RBC: 5.53 MIL/uL (ref 4.22–5.81)
RDW: 15.4 % (ref 11.5–15.5)
WBC: 6.5 10*3/uL (ref 4.0–10.5)
nRBC: 0 % (ref 0.0–0.2)

## 2019-04-28 LAB — COMPREHENSIVE METABOLIC PANEL
ALT: 22 U/L (ref 0–44)
AST: 28 U/L (ref 15–41)
Albumin: 4.1 g/dL (ref 3.5–5.0)
Alkaline Phosphatase: 62 U/L (ref 38–126)
Anion gap: 10 (ref 5–15)
BUN: 19 mg/dL (ref 6–20)
CO2: 26 mmol/L (ref 22–32)
Calcium: 9.1 mg/dL (ref 8.9–10.3)
Chloride: 102 mmol/L (ref 98–111)
Creatinine, Ser: 0.72 mg/dL (ref 0.61–1.24)
GFR calc Af Amer: >60 mL/min (ref >60)
GFR calc non Af Amer: >60 mL/min (ref >60)
Glucose, Bld: 114 mg/dL — ABNORMAL HIGH (ref 70–99)
Potassium: 3.7 mmol/L (ref 3.5–5.1)
Sodium: 138 mmol/L (ref 135–145)
Total Bilirubin: 1 mg/dL (ref 0.3–1.2)
Total Protein: 6.9 g/dL (ref 6.5–8.1)

## 2019-04-28 LAB — TROPONIN I (HIGH SENSITIVITY)
Troponin I (High Sensitivity): 5 ng/L (ref <18)
Troponin I (High Sensitivity): 5 ng/L (ref <18)

## 2019-04-28 MED ORDER — ACETAMINOPHEN 500 MG PO TABS
1000.0000 mg | ORAL_TABLET | Freq: Once | ORAL | Status: AC
  Administered 2019-04-28: 1000 mg via ORAL
  Filled 2019-04-28: qty 2

## 2019-04-28 MED ORDER — ASPIRIN 81 MG PO CHEW
324.0000 mg | CHEWABLE_TABLET | Freq: Once | ORAL | Status: AC
Start: 2019-04-28 — End: 2019-04-28
  Administered 2019-04-28: 324 mg via ORAL
  Filled 2019-04-28: qty 4

## 2019-04-28 NOTE — ED Notes (Signed)
Report received from [place], Therapist, sports. Pt is currently resting. Denies CP at this time. Pt c/o headache pain 5/10. Pt A&Ox4 and NAD at this time.

## 2019-04-28 NOTE — ED Provider Notes (Signed)
[facility] Emergency Department Provider Note  ____________________________________________  Time seen: Approximately 6:04 AM  I have reviewed the triage vital signs and the nursing notes.   HISTORY  Chief Complaint Chest Pain and Loss of Consciousness   HPI Joshua Stuart is a 49 y.o. male the history of thalassemia minor and kidney stones who presents for evaluation of chest pain.  Patient reports being in his usual state of health when he went to bed last night.  Woke up at 5 AM with 10 out of 10 chest pain that he describes as substernal pressure associated with diaphoresis.  Patient reports feeling dizzy and had a syncopal episode lasting several seconds.  When patient woke up pain had resolved.  Patient is asymptomatic at this time.  No history of smoking or coronary artery disease.  Patient has family history of coronary artery disease on parents.   No personal or or family history of blood clots, recent travel immobilization, leg pain or swelling, hemoptysis or exogenous hormones.  Patient reports that he runs a couple of miles every day and never has chest pain or shortness of breath.  Patient does report history GERD and esophageal spasms in the past.  He also has a history of vasovagal syncope and has had a couple of episodes of syncope in the past.  Past Medical History:  Diagnosis Date   Kidney stone 2019   Thalassemia minor     Patient Active Problem List   Diagnosis Date Noted   Ureteral calculus 99991111   Renal colic 99991111    Past Surgical History:  Procedure Laterality Date   EXTRACORPOREAL SHOCK WAVE LITHOTRIPSY Right 01/05/2017   Procedure: EXTRACORPOREAL SHOCK WAVE LITHOTRIPSY (ESWL);  Surgeon: Hollice Espy, MD;  Location: [facility] ORS;  Service: Urology;  Laterality: Right;    Prior to Admission medications   Medication Sig Start Date End Date Taking? Authorizing Provider  famotidine (PEPCID) 20 MG tablet Take 20 mg  by mouth daily as needed for heartburn.   Yes [provider]    Allergies Patient has no known allergies.  FH CAD - father  Social History Social History   Tobacco Use   Smoking status: Never Smoker   Smokeless tobacco: Never Used  Substance Use Topics   Alcohol use: No   Drug use: No    Review of Systems  Constitutional: Negative for fever. + syncope Eyes: Negative for visual changes. ENT: Negative for sore throat. Neck: No neck pain  Cardiovascular: + chest pain. Respiratory: Negative for shortness of breath. Gastrointestinal: Negative for abdominal pain, vomiting or diarrhea. Genitourinary: Negative for dysuria. Musculoskeletal: Negative for back pain. Skin: Negative for rash. Neurological: Negative for headaches, weakness or numbness. Psych: No SI or HI  ____________________________________________   PHYSICAL EXAM:  VITAL SIGNS: ED Triage Vitals  Enc Vitals Group     BP --      Pulse Rate 04/28/19 0557 (!) 54     Resp 04/28/19 0557 11     Temp 04/28/19 0557 98.8 F (37.1 C)     Temp Source 04/28/19 0557 Oral     SpO2 04/28/19 0557 100 %     Weight 04/28/19 0600 195 lb (88.5 kg)     Height 04/28/19 0600 6\' 1"  (1.854 m)     Head Circumference --      Peak Flow --      Pain Score 04/28/19 0557 0     Pain Loc --  Pain Edu? --      Excl. in [place]? --     Constitutional: Alert and oriented. Well appearing and in no apparent distress. HEENT:      Head: Normocephalic and atraumatic.         Eyes: Conjunctivae are normal. Sclera is non-icteric.       Mouth/Throat: Mucous membranes are moist.       Neck: Supple with no signs of meningismus. Cardiovascular: Regular rate and rhythm. No murmurs, gallops, or rubs. 2+ symmetrical distal pulses are present in all extremities. No JVD. Respiratory: Normal respiratory effort. Lungs are clear to auscultation bilaterally. No wheezes, crackles, or rhonchi.  Gastrointestinal: Soft, non tender, and non  distended  Musculoskeletal: Nontender with normal range of motion in all extremities. No edema, cyanosis, or erythema of extremities. Neurologic: Normal speech and language. Face is symmetric. Moving all extremities. No gross focal neurologic deficits are appreciated. Skin: Skin is warm, dry and intact. No rash noted. Psychiatric: Mood and affect are normal. Speech and behavior are normal.  ____________________________________________   LABS (all labs ordered are listed, but only abnormal results are displayed)  Labs Reviewed  CBC WITH DIFFERENTIAL/PLATELET - Abnormal; Notable for the following components:      Result Value   Hemoglobin 11.0 (*)    HCT 35.2 (*)    MCV 63.7 (*)    MCH 19.9 (*)    All other components within normal limits  COMPREHENSIVE METABOLIC PANEL - Abnormal; Notable for the following components:   Glucose, Bld 114 (*)    All other components within normal limits  TROPONIN I (HIGH SENSITIVITY)   ____________________________________________  EKG  ED ECG REPORT I, Rudene Re, the attending physician, personally viewed and interpreted this ECG.  Sinus bradycardia with a rate of 59, normal intervals, normal axis, no ST elevations or depressions. ____________________________________________  RADIOLOGY  I have personally reviewed the images performed during this visit and I agree with the Radiologist's read.   Interpretation by Radiologist:  DG Chest Portable 1 View  Result Date: 04/28/2019 CLINICAL DATA:  Chest pain EXAM: PORTABLE CHEST 1 VIEW COMPARISON:  None. FINDINGS: The heart size and mediastinal contours are within normal limits. No consolidation or edema. No pleural effusion or pneumothorax. The visualized skeletal structures are unremarkable. IMPRESSION: No acute process in the chest. Electronically Signed   By: Macy Mis M.D.   On: 04/28/2019 06:21     ____________________________________________   PROCEDURES  Procedure(s)  performed: None Procedures Critical Care performed:  None ____________________________________________   INITIAL IMPRESSION / ASSESSMENT AND PLAN / ED COURSE  49 y.o. male the history of thalassemia minor and kidney stones who presents for evaluation of sudden onset of severe substernal pressure associated with a syncopal episode and diaphoresis.  Patient arrives asymptomatic.  Initial EKG per EMS showing no acute ischemic changes.  EKG with no evidence of STEMI or acute ischemia upon arrival to the emergency room. Rhythm strip in the room showing sinus bradycardia.  Dds ACS vs dysrhythmia versus severe GERD/ esophageal spasm causing vasovagal syncope.  With symptoms fully resolved less likely PE or dissection especially with no tachycardia, no tachypnea, no hypoxia, no pain radiating to the back, no elevated blood pressure, no neurological deficits, and normal mediastinum on chest x-ray.  Plan for aspirin, cardiac telemetry, labs, chest x-ray, cycle troponins.  Will discuss with cardiology.    _________________________ 6:56 AM on 04/28/2019 -----------------------------------------  Patient reassessed.  Remains without any further episodes of chest pain.  Complaining  of a mild headache.  He does have a history of migraines.  Tylenol has been ordered.  His first troponin is 5.  Labs are within normal limits with no significant electrolyte abnormalities and stable mild anemia.  Chest x-ray with no evidence of pneumothorax or edema or pneumonia.  Had a very long discussion with the patient about admission for further evaluation, serial troponins, echo versus left heart cath.  At this time patient wishes to wait for repeat troponin and if that is positive that he will agree with admission.  Otherwise he prefers to go home to have follow-up in the office on Monday.  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 Will continue to  monitor for now. Dr. Saunders Revel and Dr. Audrie Gallus were informed of patient's condition per patient's request. Care transferred to Dr. Jari Pigg      _____________________________________________ Please note:  Patient was evaluated in Emergency Department today for the symptoms described in the history of present illness. Patient was evaluated in the context of the global COVID-19 pandemic, which necessitated consideration that the patient might be at risk for infection with the SARS-CoV-2 virus that causes COVID-19. Institutional protocols and algorithms that pertain to the evaluation of patients at risk for COVID-19 are in a state of rapid change based on information released by regulatory bodies including the CDC and federal and state organizations. These policies and algorithms were followed during the patient's care in the ED.  Some ED evaluations and interventions may be delayed as a result of limited staffing during the pandemic.   ____________________________________________   FINAL CLINICAL IMPRESSION(S) / ED DIAGNOSES   Final diagnoses:  Chest pain, unspecified type  Syncope, unspecified syncope type      NEW MEDICATIONS STARTED DURING THIS VISIT:  ED Discharge Orders    None       Note:  This document was prepared using Dragon voice recognition software and may include unintentional dictation errors.    Joshua Stuart, [place], MD 04/28/19 2303

## 2019-04-28 NOTE — Consult Note (Signed)
Cardiology Consultation:   Patient ID: Joshua Stuart MRN: [id number]; DOB: 07-28-1970  Admit date: 04/28/2019 Date of Consult: 04/28/2019  Primary Care Provider: Patient, No Pcp Per Primary Cardiologist: New - Armarion Greek Primary Electrophysiologist:  None    Patient Profile:   Joshua Stuart is a 49 y.o. male with a hx of vasovagal syncope, kidney stones, and thalassemia minor who is being seen today for the evaluation of chest pain and syncope at the request of Dr. Alfred Levins.  History of Present Illness:   Joshua Stuart was in his usual state of health and was awoken by severe, left-sided chest pain that he describes as a pressure-like sensation.  He went to the bathroom and took Tums, thinking this could be heartburn, without significant relief.  However, he began to feel very lightheaded, ultimately kneeling down and passing out.  He awoke to find his wife yelling next to him.  She reportedly noted that her husband's eyes were open and that he was shaking, staying unresponsive for ~15 seconds.  When he awoke, he felt diaphoretic and looked very pale in the mirror.  His chest pain began to improve at this point.  EMS was called and transported Joshua Stuart to the ED, where he has remained chest pain free and back to his baseline.  Joshua Stuart typically runs 2-3 miles per day and also bikes and plays tennis without chest pain or other limitations.  He notes that he has a family history of coronary artery disease, with both his father and uncle having had coronary stents placed.  He notes that his GERD has been acting up recently, for which he uses as needed famotidine.  Heart Pathway Score:  HEAR Score: 2  Past Medical History:  Diagnosis Date  . Kidney stone 2019  . Thalassemia minor   . Vasovagal syncope     Past Surgical History:  Procedure Laterality Date  . EXTRACORPOREAL SHOCK WAVE LITHOTRIPSY Right 01/05/2017   Procedure: EXTRACORPOREAL SHOCK WAVE LITHOTRIPSY (ESWL);  Surgeon: Hollice Espy, MD;  Location: [facility] ORS;  Service: Urology;  Laterality: Right;     Home Medications:  Prior to Admission medications   Medication Sig Start Date Joshua Stuart Date Taking? Authorizing Provider  famotidine (PEPCID) 20 MG tablet Take 20 mg by mouth daily as needed for heartburn.   Yes [provider]    Inpatient Medications: Scheduled Meds:  Continuous Infusions:  PRN Meds:   Allergies:   No Known Allergies  Social History:   Social History   Tobacco Use  . Smoking status: Never Smoker  . Smokeless tobacco: Never Used  Substance Use Topics  . Alcohol use: No  . Drug use: No     Family History:   Family History  Problem Relation Age of Onset  . Coronary artery disease Father        s/p PCI  . Coronary artery disease Paternal Uncle      ROS:  Please see the history of present illness. All other ROS reviewed and negative.     Physical Exam/Data:   Vitals:   04/28/19 0615 04/28/19 0630 04/28/19 0700 04/28/19 0815  BP: 114/70 114/85 101/74 102/75  Pulse: 63 61 (!) 55 (!) 57  Resp: 10 11 11 10   Temp:      TempSrc:      SpO2: 100% 100% 99% 99%  Weight:      Height:       No intake or output data in the 24 hours ending 04/28/19  [id number] Last 3 Weights 04/28/2019  Weight (lbs) 195 lb  Weight (kg) 88.451 kg  Some encounter information is confidential and restricted. Go to Review Flowsheets activity to see all data.     Body mass index is 25.73 kg/m.  General:  Well nourished, well developed, in no acute distress. HEENT: normal Lymph: no adenopathy Neck: no JVD Endocrine:  No thryomegaly Vascular: No carotid bruits; FA pulses 2+ bilaterally without bruits  Cardiac:  Bradycardic but regular without murmurs, rubs or gallops. Lungs:  clear to auscultation bilaterally, no wheezing, rhonchi or rales  Abd: soft, nontender, no hepatomegaly  Ext: no edema Musculoskeletal:  No deformities, BUE and BLE strength normal and equal Skin: warm and dry  Neuro:  CNs  2-12 intact, no focal abnormalities noted Psych:  Normal affect   EKG:  The EKG was personally reviewed and demonstrates:  Sinus bradycardia with first degree AV block and borderline LVH Telemetry:  Telemetry was personally reviewed and demonstrates:  Sinus rhythm.  Relevant CV Studies: None  Laboratory Data:  High Sensitivity Troponin:   Recent Labs  Lab 04/28/19 0604  TROPONINIHS 5     Chemistry Recent Labs  Lab 04/28/19 0604  NA 138  K 3.7  CL 102  CO2 26  GLUCOSE 114*  BUN 19  CREATININE 0.72  CALCIUM 9.1  GFRNONAA >60  GFRAA >60  ANIONGAP 10    Recent Labs  Lab 04/28/19 0604  PROT 6.9  ALBUMIN 4.1  AST 28  ALT 22  ALKPHOS 62  BILITOT 1.0   Hematology Recent Labs  Lab 04/28/19 0604  WBC 6.5  RBC 5.53  HGB 11.0*  HCT 35.2*  MCV 63.7*  MCH 19.9*  MCHC 31.3  RDW 15.4  PLT 175   BNPNo results for input(s): BNP, PROBNP in the last 168 hours.  DDimer No results for input(s): DDIMER in the last 168 hours.   Radiology/Studies:  DG Chest Portable 1 View  Result Date: 04/28/2019 CLINICAL DATA:  Chest pain EXAM: PORTABLE CHEST 1 VIEW COMPARISON:  None. FINDINGS: The heart size and mediastinal contours are within normal limits. No consolidation or edema. No pleural effusion or pneumothorax. The visualized skeletal structures are unremarkable. IMPRESSION: No acute process in the chest. Electronically Signed   By: Macy Mis M.D.   On: 04/28/2019 06:21   HEART Score (for undifferentiated chest pain):  HEART Score: 2    Assessment and Plan:   Chest pain: Pain began abruptly overnight and awoke Joshua Stuart from sleep.  He has not had any exertional chest pain or dyspnea, as he runs regularly (as recently as yesterday).  EKG does not show ischemic changes and first high-sensitivity troponin I is negative.  If second troponin is stable (i.e. no significant delta) and Joshua Stuart remains chest pain free, I think it would be reasonable for him to be discharged  home and to have outpatient ischemia testing within the coming week.  I would favor cardiac CTA.  It may be worthwhile to take standing famotidine or a course of PPI for possible component of GERD and esophageal spasm.  Syncope: Petra Kuba of syncope with preceding lightheadedness and subsequent diaphoresis and pallor suggests a vasovagal etiology, particularly with Dr. Tyrell Stuart history of vasovagal syncope.  Telemetry in the ED has been unrevealing.  EKG was notable for mild 1st degree AV block.  If second HS-TnI is normal, I would favor close outpatient follow-up with cardiac CTA and echocardiogram.  Disposition: As above, if second troponin is stable,  I think it would be ok for discharge home and close outpatient follow.  If troponin is uptrending, observation with further inpatient workup (echo +/- LHC) will need to be considered).  For questions or updates, please contact [place] Please consult [email or web address] for contact info under [facility].  Signed, Nelva Bush, MD  04/28/2019 8:34 AM

## 2019-04-28 NOTE — Discharge Instructions (Addendum)
Cardiac markers were negative x2.  You can follow-up with your cardiology clinic for further work-up.  Return the ER for recurrent syncope, worsening chest discomfort or any other concerns

## 2019-04-28 NOTE — ED Triage Notes (Signed)
Patient arrived from home via [facility].  Pt states he woke at 5 am with 10/10 chest pain, dizziness, diaphoresis, LOC.  When he regained conciousness the pain had subsided and he felt better.  [facility] reports: 12 lead unremarkable, vitals wdl, does not take any medications, CBG 132, 20 in right [facility]  Patient arrives in no apparent distress at this time, states his pain is currently pain 0/10.

## 2019-04-28 NOTE — ED Provider Notes (Signed)
7:08 AM Assumed care for off going team.   Blood pressure 114/85, pulse 61, temperature 98.8 F (37.1 C), temperature source Oral, resp. rate 11, height 6\' 1"  (1.854 m), weight 88.5 kg, SpO2 100 %.  See their HPI for full report but in brief Woke up at 5am, substernal chest pressure, diaphoretic, syncopal event-- pain is now gone. EMS EKG, trop negative. H/o esophageal spasm. 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   Repeat cardiac marker is negative.  Dr. Saunders Revel has been by to see the patient and he will follow-up in clinic.  Patient continues to feel asymptomatic and is able to ambulate to the bathroom.  Will discharge patient home with cardiology follow-up.  I discussed the provisional nature of ED diagnosis, the treatment so far, the ongoing plan of care, follow up appointments and return precautions with the patient and any family or support people present. They expressed understanding and agreed with the plan, discharged home.             Vanessa [place], MD 04/28/19 [phone]

## 2019-04-29 ENCOUNTER — Encounter (HOSPITAL_COMMUNITY): Payer: Self-pay

## 2019-04-30 ENCOUNTER — Ambulatory Visit (HOSPITAL_COMMUNITY)
Admission: RE | Admit: 2019-04-30 | Discharge: 2019-04-30 | Disposition: A | Discharge status: Home / Self Care | Payer: 59 | Source: Ambulatory Visit | Attending: Internal Medicine | Admitting: Internal Medicine

## 2019-04-30 ENCOUNTER — Other Ambulatory Visit: Payer: Self-pay

## 2019-04-30 ENCOUNTER — Encounter (HOSPITAL_COMMUNITY): Payer: Self-pay

## 2019-04-30 DIAGNOSIS — R079 Chest pain, unspecified: Secondary | ICD-10-CM | POA: Diagnosis not present

## 2019-04-30 MED ORDER — NITROGLYCERIN 0.4 MG SL SUBL
SUBLINGUAL_TABLET | SUBLINGUAL | Status: AC
  Filled 2019-04-30: qty 2

## 2019-04-30 MED ORDER — IOHEXOL 350 MG/ML SOLN
80.0000 mL | Freq: Once | INTRAVENOUS | Status: AC | PRN
  Administered 2019-04-30: 80 mL via INTRAVENOUS

## 2019-04-30 MED ORDER — NITROGLYCERIN 0.4 MG SL SUBL
0.8000 mg | SUBLINGUAL_TABLET | Freq: Once | SUBLINGUAL | Status: AC
  Administered 2019-04-30: 0.8 mg via SUBLINGUAL

## 2019-05-02 ENCOUNTER — Other Ambulatory Visit: Payer: Self-pay | Admitting: Internal Medicine

## 2019-05-02 DIAGNOSIS — R55 Syncope and collapse: Secondary | ICD-10-CM

## 2019-05-13 ENCOUNTER — Ambulatory Visit (INDEPENDENT_AMBULATORY_CARE_PROVIDER_SITE_OTHER): Payer: 59

## 2019-05-13 ENCOUNTER — Other Ambulatory Visit: Payer: Self-pay

## 2019-05-13 DIAGNOSIS — R55 Syncope and collapse: Secondary | ICD-10-CM | POA: Diagnosis not present

## 2020-07-06 ENCOUNTER — Other Ambulatory Visit: Payer: Self-pay | Admitting: General Surgery

## 2020-07-06 DIAGNOSIS — K4090 Unilateral inguinal hernia, without obstruction or gangrene, not specified as recurrent: Secondary | ICD-10-CM | POA: Diagnosis not present

## 2020-07-06 NOTE — Progress Notes (Addendum)
Joshua Stuart [id number] 1970-06-13     HPI: 50 year old male with 10-month history of progressive discomfort in the right groin and recent identification of a bulge.  Most symptomatic after long periods of standing or running.  (Not in a hospital admission)  No Known Allergies Past Medical History:  Diagnosis Date  . Kidney stone 2019  . Thalassemia minor   . Vasovagal syncope    Past Surgical History:  Procedure Laterality Date  . EXTRACORPOREAL SHOCK WAVE LITHOTRIPSY Right 01/05/2017   Procedure: EXTRACORPOREAL SHOCK WAVE LITHOTRIPSY (ESWL);  Surgeon: Hollice Espy, MD;  Location: [facility] ORS;  Service: Urology;  Laterality: Right;   Social History   Socioeconomic History  . Marital status: Married    Spouse name: Not on file  . Number of children: Not on file  . Years of education: Not on file  . Highest education level: Not on file  Occupational History  . Not on file  Tobacco Use  . Smoking status: Never Smoker  . Smokeless tobacco: Never Used  Vaping Use  . Vaping Use: Never used  Substance and Sexual Activity  . Alcohol use: No  . Drug use: No  . Sexual activity: Not on file  Other Topics Concern  . Not on file  Social History Narrative  . Not on file   Social Determinants of Health   Financial Resource Strain: Not on file  Food Insecurity: Not on file  Transportation Needs: Not on file  Physical Activity: Not on file  Stress: Not on file  Social Connections: Not on file  Intimate Partner Violence: Not on file   Social History   Social History Narrative  . Not on file     ROS: Negative.     PE: HEENT: Negative. Lungs: Clear. Cardio: RR. Abdomen: Reducible right inguinal hernia.  Assessment/Plan:  Proceed with planned right inguinal hernia repair. Forest Gleason Chasmine Lender 07/06/2020  Subjective:     Patient ID: Joshua Stuart is a 50 y.o. male.  HPI  The following portions of the patient's history were reviewed and updated as  appropriate.  This a new patient is here today for: office visit. The patient is here today for evaluation of a right inguinal hernia. He reports it has been present for about 6 months but is getting worse.  Most notably symptomatic after long periods of standing.  Patient does report a bulge that is becoming harder to reduce. The patient reports that the bulge is worse with exercise. He is wearing a support binder for comfort. Patient reports he has bowel movements one to two times per day.   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       Chief Complaint  Patient presents with  . Inguinal Hernia    right     BP 122/76   Pulse 70   Temp 36.4 C (97.5 F)   Ht 185.4 cm (6\' 1" )   Wt 90.3 kg (199 lb)   SpO2 97%   BMI 26.25 kg/m       Past Medical History:  Diagnosis Date  . Kidney stone 2019  . Thalassemia minor   . Vasovagal syncope           Past Surgical History:  Procedure Laterality Date  . extracorporeal shock wave lithotripsy Right 01/05/2017       Social History         Socioeconomic History  . Marital status: Married  Tobacco Use  . Smoking  status: Never Smoker  . Smokeless tobacco: Never Used  Substance and Sexual Activity  . Alcohol use: Not Currently  . Drug use: Never       No Known Allergies  Current Medications  No current outpatient medications on file.   No current facility-administered medications for this visit.           Family History  Problem Relation Age of Onset  . Coronary Artery Disease (Blocked arteries around heart) Father   . Coronary Artery Disease (Blocked arteries around heart) Paternal Uncle   . Colon cancer Paternal Grandmother         Review of Systems  Constitutional: Negative for chills and fever.  Respiratory: Negative for cough.        Objective:   Physical Exam Constitutional:      Appearance: Normal appearance.  Cardiovascular:     Rate  and Rhythm: Normal rate and regular rhythm.     Pulses: Normal pulses.     Heart sounds: Normal heart sounds.  Pulmonary:     Effort: Pulmonary effort is normal.     Breath sounds: Normal breath sounds.  Abdominal:     Hernia: A hernia is present. Hernia is present in the right inguinal area (reducible in standing position. ). There is no hernia in the left inguinal area.  Genitourinary:    Penis: Normal.      Testes: Normal.  Neurological:     Mental Status: He is alert and oriented to person, place, and time.  Psychiatric:        Mood and Affect: Mood normal.        Behavior: Behavior normal.    Labs and Radiology:   Cardiac CT of April 30, 2019 showed a calcium score of "0".  April 28, 2019 laboratory:  WBC 4.0 - 10.5 K/uL 6.5   RBC 4.22 - 5.81 MIL/uL 5.53   Hemoglobin 13.0 - 17.0 g/dL 11.0Low   HCT 39.0 - 52.0 % 35.2Low   MCV 80.0 - 100.0 fL 63.7Low   MCH 26.0 - 34.0 pg 19.9Low   MCHC 30.0 - 36.0 g/dL 31.3   RDW 11.5 - 15.5 % 15.4   Platelets 150 - 400 K/uL 175   nRBC 0.0 - 0.2 % 0.0   Neutrophils Relative % % 51   Neutro Abs 1.7 - 7.7 K/uL 3.4   Lymphocytes Relative % 40   Lymphs Abs 0.7 - 4.0 K/uL 2.6   Monocytes Relative % 6   Monocytes Absolute 0.1 - 1.0 K/uL 0.4   Eosinophils Relative % 2   Eosinophils Absolute 0.0 - 0.5 K/uL 0.1   Basophils Relative % 0   Basophils Absolute 0.0 - 0.1 K/uL 0.0   WBC Morphology  TOXIC GRANULATION   RBC Morphology  See Note   Comment: MIXED RBC POPULATION  Smear Review  MORPHOLOGY UNREMARKABLE   Immature Granulocytes % 1   Abs Immature Granulocytes 0.00 - 0.07 K/uL 0.03   Comment: Performed at [facility], [address] [place], [place] [address]   CBC unchanged from 3 years prior.   Ref Range & Units 1 yr ago  Sodium 135 - 145 mmol/L 138   Potassium 3.5 - 5.1 mmol/L 3.7   Chloride 98 - 111 mmol/L 102   CO2 22 - 32 mmol/L 26   Glucose, Bld 70 - 99 mg/dL 114High   Comment: Glucose  reference range applies only to samples taken after fasting for at least 8 hours.  BUN 6 -  20 mg/dL 19   Creatinine, Ser 0.61 - 1.24 mg/dL 0.72   Calcium 8.9 - 10.3 mg/dL 9.1   Total Protein 6.5 - 8.1 g/dL 6.9   Albumin 3.5 - 5.0 g/dL 4.1   AST 15 - 41 U/L 28   ALT 0 - 44 U/L 22   Alkaline Phosphatase 38 - 126 U/L 62   Total Bilirubin 0.3 - 1.2 mg/dL 1.0   GFR calc non Af Amer >60 mL/min >60   GFR calc Af Amer >60 mL/min >60   Anion gap 5 - 15 10       Assessment:     Symptomatic right inguinal hernia.  Microcytic anemia (stable over 3 years).     Plan:     Options for management reviewed.  He has failed nonoperative therapy with the use of a truss.  Indications for surgical repair with prosthetic mesh discussed.  Anticipate return to full activities in 7 to 10 days.  No driving until pain-free.  No lifting over 10 pounds for the first week.  Patient to be scheduled for outpatient surgery at a convenient date.     This note is partially prepared by Ledell Noss, CMA acting as a scribe in the presence of Dr. Hervey Ard, MD.   The documentation recorded by the scribe accurately reflects the service I personally performed and the decisions made by me.   Robert Bellow, MD FACS

## 2020-07-06 NOTE — Addendum Note (Signed)
Addended byHervey Ard on: 07/06/2020 12:46 PM   Modules accepted: Orders, SmartSet

## 2020-07-08 ENCOUNTER — Other Ambulatory Visit: Payer: Self-pay

## 2020-07-08 ENCOUNTER — Other Ambulatory Visit
Admission: RE | Admit: 2020-07-08 | Discharge: 2020-07-08 | Disposition: A | Discharge status: Home / Self Care | Payer: 59 | Source: Ambulatory Visit | Attending: General Surgery | Admitting: General Surgery

## 2020-07-08 NOTE — Patient Instructions (Signed)
Your procedure is scheduled on: Jul 10, 2020 FRIDAY Report to the Registration Desk on the 1st floor of the [facility]. To find out your arrival time, please call [phone] between 1PM - 3PM on: Jul 09, 2020 THURSDAY  REMEMBER: Instructions that are not followed completely may result in serious medical risk, up to and including death; or upon the discretion of your surgeon and anesthesiologist your surgery may need to be rescheduled.  Do not eat food after midnight the night before surgery.  No gum chewing, lozengers or hard candies.  You may however, drink CLEAR liquids up to 2 hours before you are scheduled to arrive for your surgery. Do not drink anything within 2 hours of your scheduled arrival time.  Clear liquids include: - water  - apple juice without pulp - gatorade (not RED, PURPLE, OR BLUE) - black coffee or tea (Do NOT add milk or creamers to the coffee or tea) Do NOT drink anything that is not on this list.   TAKE THESE MEDICATIONS THE MORNING OF SURGERY WITH A SIP OF WATER: NONE  One week prior to surgery: Stop Anti-inflammatories (NSAIDS) such as Advil, Aleve, Ibuprofen, Motrin, Naproxen, Naprosyn and ASPIRIN OR Aspirin based products such as Excedrin, Goodys Powder, BC Powder. Stop ANY OVER THE COUNTER supplements until after surgery.  No Alcohol for 24 hours before or after surgery.  No Smoking including e-cigarettes for 24 hours prior to surgery.  No chewable tobacco products for at least 6 hours prior to surgery.  No nicotine patches on the day of surgery.  Do not use any "recreational" drugs for at least a week prior to your surgery.  Please be advised that the combination of cocaine and anesthesia may have negative outcomes, up to and including death. If you test positive for cocaine, your surgery will be cancelled.  On the morning of surgery brush your teeth with toothpaste and water, you may rinse your mouth with mouthwash if you wish. Do not swallow  any toothpaste or mouthwash.  Do not wear jewelry, make-up, hairpins, clips or nail polish.  Do not wear lotions, powders, or perfumes OR DEODORANT  Do not shave body from the neck down 48 hours prior to surgery just in case you cut yourself which could leave a site for infection.  Also, freshly shaved skin may become irritated if using the CHG soap.  Contact lenses, hearing aids and dentures may not be worn into surgery.  Do not bring valuables to the hospital. [facility] is not responsible for any missing/lost belongings or valuables.   Use CHG Soap as directed on instruction sheet.  Notify your doctor if there is any change in your medical condition (cold, fever, infection).  Wear comfortable clothing (specific to your surgery type) to the hospital.  Plan for stool softeners for home use; pain medications have a tendency to cause constipation. You can also help prevent constipation by eating foods high in fiber such as fruits and vegetables and drinking plenty of fluids as your diet allows.  After surgery, you can help prevent lung complications by doing breathing exercises.  Take deep breaths and cough every 1-2 hours. Your doctor may order a device called an Incentive Spirometer to help you take deep breaths. When coughing or sneezing, hold a pillow firmly against your incision with both hands. This is called "splinting." Doing this helps protect your incision. It also decreases belly discomfort.  If you are being discharged the day of surgery, you will not  be allowed to drive home. You will need a responsible adult (18 years or older) to drive you home and stay with you that night.   If you are taking public transportation, you will need to have a responsible adult (18 years or older) with you. Please confirm with your physician that it is acceptable to use public transportation.   Please call the [facility] Dept[facility] at [phone] if you have any questions about  these instructions.  Surgery Visitation Policy:  Patients undergoing a surgery or procedure may have one family member or support person with them as long as that person is not COVID-19 positive or experiencing its symptoms.  That person may remain in the waiting area during the procedure.  Inpatient Visitation:    Visiting hours are 7 a.m. to 8 p.m. Inpatients will be allowed two visitors daily. The visitors may change each day during the patient's stay. No visitors under the age of 25. Any visitor under the age of 28 must be accompanied by an adult. The visitor must pass COVID-19 screenings, use hand sanitizer when entering and exiting the patient's room and wear a mask at all times, including in the patient's room. Patients must also wear a mask when staff or their visitor are in the room. Masking is required regardless of vaccination status.

## 2020-07-10 ENCOUNTER — Ambulatory Visit: Payer: 59 | Admitting: Urgent Care

## 2020-07-10 ENCOUNTER — Ambulatory Visit
Admission: RE | Admit: 2020-07-10 | Discharge: 2020-07-10 | Disposition: A | Discharge status: Home / Self Care | Payer: 59 | Source: Ambulatory Visit | Attending: General Surgery | Admitting: General Surgery

## 2020-07-10 ENCOUNTER — Other Ambulatory Visit: Payer: Self-pay

## 2020-07-10 ENCOUNTER — Encounter: Payer: Self-pay | Admitting: General Surgery

## 2020-07-10 ENCOUNTER — Encounter
Admission: RE | Disposition: A | Discharge status: Home / Self Care | Payer: Self-pay | Source: Ambulatory Visit | Attending: General Surgery

## 2020-07-10 DIAGNOSIS — Z7982 Long term (current) use of aspirin: Secondary | ICD-10-CM | POA: Diagnosis not present

## 2020-07-10 DIAGNOSIS — D176 Benign lipomatous neoplasm of spermatic cord: Secondary | ICD-10-CM | POA: Insufficient documentation

## 2020-07-10 DIAGNOSIS — K4090 Unilateral inguinal hernia, without obstruction or gangrene, not specified as recurrent: Secondary | ICD-10-CM | POA: Insufficient documentation

## 2020-07-10 HISTORY — PX: INGUINAL HERNIA REPAIR: SHX194

## 2020-07-10 SURGERY — REPAIR, HERNIA, INGUINAL, ADULT
Anesthesia: General | Site: Inguinal | Laterality: Right

## 2020-07-10 MED ORDER — CEFAZOLIN SODIUM-DEXTROSE 2-4 GM/100ML-% IV SOLN
2.0000 g | INTRAVENOUS | Status: AC
  Administered 2020-07-10: 2 g via INTRAVENOUS

## 2020-07-10 MED ORDER — FAMOTIDINE 20 MG PO TABS
ORAL_TABLET | ORAL | Status: AC
  Administered 2020-07-10: 20 mg
  Filled 2020-07-10: qty 1

## 2020-07-10 MED ORDER — PROPOFOL 10 MG/ML IV BOLUS
INTRAVENOUS | Status: DC | PRN
  Administered 2020-07-10: 180 mg via INTRAVENOUS

## 2020-07-10 MED ORDER — DEXAMETHASONE SODIUM PHOSPHATE 10 MG/ML IJ SOLN
INTRAMUSCULAR | Status: AC
  Filled 2020-07-10: qty 1

## 2020-07-10 MED ORDER — OXYCODONE HCL 5 MG/5ML PO SOLN: 5.0000 mg | Freq: Once | ORAL | Status: DC | PRN

## 2020-07-10 MED ORDER — HYDROCODONE-ACETAMINOPHEN 5-325 MG PO TABS
1.0000 | ORAL_TABLET | ORAL | 0 refills | Status: AC | PRN
  Filled 2020-07-10: qty 10, 2d supply, fill #0

## 2020-07-10 MED ORDER — BUPIVACAINE-EPINEPHRINE (PF) 0.5% -1:200000 IJ SOLN
INTRAMUSCULAR | Status: AC
  Filled 2020-07-10: qty 30

## 2020-07-10 MED ORDER — ONDANSETRON HCL 4 MG/2ML IJ SOLN
INTRAMUSCULAR | Status: AC
  Filled 2020-07-10: qty 2

## 2020-07-10 MED ORDER — DEXAMETHASONE SODIUM PHOSPHATE 10 MG/ML IJ SOLN
INTRAMUSCULAR | Status: DC | PRN
  Administered 2020-07-10: 8 mg via INTRAVENOUS

## 2020-07-10 MED ORDER — LIDOCAINE HCL (CARDIAC) PF 100 MG/5ML IV SOSY
PREFILLED_SYRINGE | INTRAVENOUS | Status: DC | PRN
  Administered 2020-07-10: 100 mg via INTRAVENOUS

## 2020-07-10 MED ORDER — CHLORHEXIDINE GLUCONATE 0.12 % MT SOLN
OROMUCOSAL | Status: AC
  Filled 2020-07-10: qty 15

## 2020-07-10 MED ORDER — FAMOTIDINE 20 MG PO TABS
20.0000 mg | ORAL_TABLET | Freq: Once | ORAL | Status: AC
  Administered 2020-07-10: 20 mg via ORAL

## 2020-07-10 MED ORDER — OXYCODONE HCL 5 MG PO TABS: 5.0000 mg | ORAL_TABLET | Freq: Once | ORAL | Status: DC | PRN

## 2020-07-10 MED ORDER — FENTANYL CITRATE (PF) 100 MCG/2ML IJ SOLN
INTRAMUSCULAR | Status: DC | PRN
  Administered 2020-07-10 (×2): 50 ug via INTRAVENOUS

## 2020-07-10 MED ORDER — CHLORHEXIDINE GLUCONATE 0.12 % MT SOLN
15.0000 mL | Freq: Once | OROMUCOSAL | Status: AC
  Administered 2020-07-10: 15 mL via OROMUCOSAL

## 2020-07-10 MED ORDER — CHLORHEXIDINE GLUCONATE CLOTH 2 % EX PADS: 6.0000 | MEDICATED_PAD | Freq: Once | CUTANEOUS | Status: DC

## 2020-07-10 MED ORDER — MIDAZOLAM HCL 2 MG/2ML IJ SOLN
INTRAMUSCULAR | Status: DC | PRN
  Administered 2020-07-10: 2 mg via INTRAVENOUS

## 2020-07-10 MED ORDER — BUPIVACAINE-EPINEPHRINE (PF) 0.5% -1:200000 IJ SOLN
INTRAMUSCULAR | Status: DC | PRN
  Administered 2020-07-10: 5 mL
  Administered 2020-07-10: 25 mL

## 2020-07-10 MED ORDER — ONDANSETRON HCL 4 MG/2ML IJ SOLN
INTRAMUSCULAR | Status: DC | PRN
  Administered 2020-07-10: 4 mg via INTRAVENOUS

## 2020-07-10 MED ORDER — MIDAZOLAM HCL 2 MG/2ML IJ SOLN
INTRAMUSCULAR | Status: AC
  Filled 2020-07-10: qty 2

## 2020-07-10 MED ORDER — FENTANYL CITRATE (PF) 100 MCG/2ML IJ SOLN
INTRAMUSCULAR | Status: AC
  Filled 2020-07-10: qty 2

## 2020-07-10 MED ORDER — KETOROLAC TROMETHAMINE 30 MG/ML IJ SOLN
INTRAMUSCULAR | Status: AC
  Filled 2020-07-10: qty 1

## 2020-07-10 MED ORDER — ACETAMINOPHEN 10 MG/ML IV SOLN
INTRAVENOUS | Status: AC
  Filled 2020-07-10: qty 100

## 2020-07-10 MED ORDER — ORAL CARE MOUTH RINSE: 15.0000 mL | Freq: Once | OROMUCOSAL | Status: AC

## 2020-07-10 MED ORDER — CEFAZOLIN SODIUM-DEXTROSE 2-4 GM/100ML-% IV SOLN
INTRAVENOUS | Status: AC
  Filled 2020-07-10: qty 100

## 2020-07-10 MED ORDER — ACETAMINOPHEN 10 MG/ML IV SOLN
INTRAVENOUS | Status: DC | PRN
  Administered 2020-07-10: 1000 mg via INTRAVENOUS

## 2020-07-10 MED ORDER — EPHEDRINE SULFATE 50 MG/ML IJ SOLN
INTRAMUSCULAR | Status: DC | PRN
  Administered 2020-07-10 (×2): 5 mg via INTRAVENOUS

## 2020-07-10 MED ORDER — FENTANYL CITRATE (PF) 100 MCG/2ML IJ SOLN: 25.0000 ug | INTRAMUSCULAR | Status: DC | PRN

## 2020-07-10 MED ORDER — LIDOCAINE HCL (PF) 2 % IJ SOLN
INTRAMUSCULAR | Status: AC
  Filled 2020-07-10: qty 5

## 2020-07-10 MED ORDER — LACTATED RINGERS IV SOLN: INTRAVENOUS | Status: DC

## 2020-07-10 SURGICAL SUPPLY — 35 items
APL PRP STRL LF DISP 70% ISPRP (MISCELLANEOUS) ×1
BLADE SURG 15 STRL SS SAFETY (BLADE) ×4 IMPLANT
CANISTER SUCT 1200ML W/VALVE (MISCELLANEOUS) ×1 IMPLANT
CHLORAPREP W/TINT 26 (MISCELLANEOUS) ×2 IMPLANT
COVER WAND RF STERILE (DRAPES) ×1 IMPLANT
DECANTER SPIKE VIAL GLASS SM (MISCELLANEOUS) ×1 IMPLANT
DRAIN PENROSE 12X.25 LTX STRL (MISCELLANEOUS) ×2 IMPLANT
DRAPE LAPAROTOMY 100X77 ABD (DRAPES) ×2 IMPLANT
DRSG TEGADERM 4X4.75 (GAUZE/BANDAGES/DRESSINGS) ×2 IMPLANT
DRSG TELFA 4X3 1S NADH ST (GAUZE/BANDAGES/DRESSINGS) ×2 IMPLANT
ELECT REM PT RETURN 9FT ADLT (ELECTROSURGICAL) ×2
ELECTRODE REM PT RTRN 9FT ADLT (ELECTROSURGICAL) ×1 IMPLANT
GLOVE SURG ENC MOIS LTX SZ7.5 (GLOVE) ×2 IMPLANT
GLOVE SURG UNDER LTX SZ8 (GLOVE) ×2 IMPLANT
GOWN STRL REUS W/ TWL LRG LVL3 (GOWN DISPOSABLE) ×2 IMPLANT
GOWN STRL REUS W/TWL LRG LVL3 (GOWN DISPOSABLE) ×4
KIT TURNOVER KIT A (KITS) ×2 IMPLANT
LABEL OR SOLS (LABEL) ×2 IMPLANT
MANIFOLD NEPTUNE II (INSTRUMENTS) ×2 IMPLANT
MESH MARLEX PLUG MEDIUM (Mesh General) ×2 IMPLANT
NEEDLE HYPO 22GX1.5 SAFETY (NEEDLE) ×4 IMPLANT
PACK BASIN MINOR ARMC (MISCELLANEOUS) ×2 IMPLANT
STRIP CLOSURE SKIN 1/2X4 (GAUZE/BANDAGES/DRESSINGS) ×2 IMPLANT
SUT PDS AB 0 CT1 27 (SUTURE) ×2 IMPLANT
SUT SURGILON 0 BLK (SUTURE) ×4 IMPLANT
SUT VIC AB 2-0 SH 27 (SUTURE) ×2
SUT VIC AB 2-0 SH 27XBRD (SUTURE) ×1 IMPLANT
SUT VIC AB 3-0 54X BRD REEL (SUTURE) ×1 IMPLANT
SUT VIC AB 3-0 BRD 54 (SUTURE) ×2
SUT VIC AB 3-0 SH 27 (SUTURE) ×2
SUT VIC AB 3-0 SH 27X BRD (SUTURE) ×1 IMPLANT
SUT VIC AB 4-0 FS2 27 (SUTURE) ×2 IMPLANT
SWABSTK COMLB BENZOIN TINCTURE (MISCELLANEOUS) ×2 IMPLANT
SYR 10ML LL (SYRINGE) ×2 IMPLANT
SYR 3ML LL SCALE MARK (SYRINGE) ×2 IMPLANT

## 2020-07-10 NOTE — Anesthesia Postprocedure Evaluation (Signed)
Anesthesia Post Note  Patient: Joshua Stuart  Procedure(s) Performed: HERNIA REPAIR INGUINAL ADULT (Right Inguinal)  Patient location during evaluation: PACU Anesthesia Type: General Level of consciousness: awake and alert Pain management: pain level controlled Vital Signs Assessment: post-procedure vital signs reviewed and stable Respiratory status: spontaneous breathing, nonlabored ventilation, respiratory function stable and patient connected to nasal cannula oxygen Cardiovascular status: blood pressure returned to baseline and stable Postop Assessment: no apparent nausea or vomiting Anesthetic complications: no   No complications documented.   Last Vitals:  Vitals:   07/10/20 1634 07/10/20 1704  BP: 123/76 117/73  Pulse: 81 73  Resp: 16 16  Temp: 36.4 C   SpO2: 100% 100%    Last Pain:  Vitals:   07/10/20 1704  TempSrc:   PainSc: 0-No pain                 Martha Clan

## 2020-07-10 NOTE — H&P (Signed)
Joshua Stuart [id number] [birth year]     HPI:  Healthy 50 y/o male with new, symptomatic right inguinal hernia. For repair.   Medications Prior to Admission  Medication Sig Dispense Refill Last Dose  . acetaminophen (TYLENOL) 500 MG tablet Take 1,000 mg by mouth every 6 (six) hours as needed for moderate pain or headache.   07/10/2020 at 200  . aspirin-acetaminophen-caffeine (EXCEDRIN MIGRAINE) 250-250-65 MG tablet Take 2 tablets by mouth every 6 (six) hours as needed for headache.   Past Week at Unknown time  . caffeine 200 MG TABS tablet Take 200 mg by mouth daily as needed (energy).   07/10/2020 at 200   No Known Allergies Past Medical History:  Diagnosis Date  . Kidney stone 2019  . Thalassemia minor   . Vasovagal syncope    Past Surgical History:  Procedure Laterality Date  . EXTRACORPOREAL SHOCK WAVE LITHOTRIPSY Right 01/05/2017   Procedure: EXTRACORPOREAL SHOCK WAVE LITHOTRIPSY (ESWL);  Surgeon: Hollice Espy, MD;  Location: [facility] ORS;  Service: Urology;  Laterality: Right;   Social History   Socioeconomic History  . Marital status: Married    Spouse name: Not on file  . Number of children: Not on file  . Years of education: Not on file  . Highest education level: Not on file  Occupational History  . Not on file  Tobacco Use  . Smoking status: Never Smoker  . Smokeless tobacco: Never Used  Vaping Use  . Vaping Use: Never used  Substance and Sexual Activity  . Alcohol use: No  . Drug use: No  . Sexual activity: Not on file  Other Topics Concern  . Not on file  Social History Narrative  . Not on file   Social Determinants of Health   Financial Resource Strain: Not on file  Food Insecurity: Not on file  Transportation Needs: Not on file  Physical Activity: Not on file  Stress: Not on file  Social Connections: Not on file  Intimate Partner Violence: Not on file   Social History   Social History Narrative  . Not on file     ROS: Negative.      PE: HEENT: Negative. Lungs: Clear. Cardio: RR.  Assessment/Plan:  Proceed with planned right inguinal hernia repair.   Forest Gleason [facility] 07/10/2020

## 2020-07-10 NOTE — Transfer of Care (Signed)
Immediate Anesthesia Transfer of Care Note  Patient: Joshua Stuart  Procedure(s) Performed: HERNIA REPAIR INGUINAL ADULT (Right Inguinal)  Patient Location: PACU  Anesthesia Type:General  Level of Consciousness: drowsy  Airway & Oxygen Therapy: Patient Spontanous Breathing and Patient connected to face mask oxygen  Post-op Assessment: Report given to RN  Post vital signs: stable  Last Vitals:  Vitals Value Taken Time  BP 107/73 07/10/20 1527  Temp    Pulse 79 07/10/20 1529  Resp 17 07/10/20 1529  SpO2 100 % 07/10/20 1529  Vitals shown include unvalidated device data.  Last Pain:  Vitals:   07/10/20 1314  PainSc: 0-No pain         Complications: No complications documented.

## 2020-07-10 NOTE — Anesthesia Procedure Notes (Signed)
Procedure Name: LMA Insertion Performed by: Keziyah Kneale, CRNA Pre-anesthesia Checklist: Patient identified, Patient being monitored, Timeout performed, Emergency Drugs available and Suction available Patient Re-evaluated:Patient Re-evaluated prior to induction Oxygen Delivery Method: Circle system utilized Preoxygenation: Pre-oxygenation with 100% oxygen Induction Type: IV induction Ventilation: Mask ventilation without difficulty LMA: LMA inserted LMA Size: 5.0 Tube type: Oral Number of attempts: 1 Placement Confirmation: positive ETCO2 and breath sounds checked- equal and bilateral Tube secured with: Tape Dental Injury: Teeth and Oropharynx as per pre-operative assessment        

## 2020-07-10 NOTE — Discharge Instructions (Signed)

## 2020-07-10 NOTE — OR Nursing (Signed)
Dr. Bary Castilla in to see pt in postop @ 4:50 pm; discharge pending patient void.

## 2020-07-10 NOTE — Anesthesia Preprocedure Evaluation (Signed)
Anesthesia Evaluation  Patient identified by MRN, date of birth, ID band Patient awake    Reviewed: Allergy & Precautions, H&P , NPO status , Patient's Chart, lab work & pertinent test results  History of Anesthesia Complications Negative for: history of anesthetic complications  Airway Mallampati: II  TM Distance: >3 FB Neck ROM: full    Dental  (+) Chipped   Pulmonary neg pulmonary ROS, neg shortness of breath,    Pulmonary exam normal        Cardiovascular Exercise Tolerance: Good (-) angina(-) Past MI and (-) DOE negative cardio ROS Normal cardiovascular exam     Neuro/Psych negative neurological ROS  negative psych ROS   GI/Hepatic negative GI ROS, Neg liver ROS, neg GERD  ,  Endo/Other  negative endocrine ROS  Renal/GU Renal disease     Musculoskeletal   Abdominal   Peds  Hematology  (+) Blood dyscrasia, ,   Anesthesia Other Findings Past Medical History: 2019: Kidney stone No date: Thalassemia minor No date: Vasovagal syncope  Past Surgical History: 01/05/2017: EXTRACORPOREAL SHOCK WAVE LITHOTRIPSY; Right     Comment:  Procedure: EXTRACORPOREAL SHOCK WAVE LITHOTRIPSY (ESWL);              Surgeon: Hollice Espy, MD;  Location: [facility] ORS;                Service: Urology;  Laterality: Right;  BMI    Body Mass Index: 25.73 kg/m      Reproductive/Obstetrics negative OB ROS                             Anesthesia Physical Anesthesia Plan  ASA: II  Anesthesia Plan: General LMA   Post-op Pain Management:    Induction: Intravenous  PONV Risk Score and Plan: Dexamethasone, Ondansetron, Midazolam and Treatment may vary due to age or medical condition  Airway Management Planned: LMA  Additional Equipment:   Intra-op Plan:   Post-operative Plan: Extubation in OR  Informed Consent: I have reviewed the patients History and Physical, chart, labs and discussed the  procedure including the risks, benefits and alternatives for the proposed anesthesia with the patient or authorized representative who has indicated his/her understanding and acceptance.     Dental Advisory Given  Plan Discussed with: Anesthesiologist, CRNA and Surgeon  Anesthesia Plan Comments: (Patient consented for risks of anesthesia including but not limited to:  - adverse reactions to medications - damage to eyes, teeth, lips or other oral mucosa - nerve damage due to positioning  - sore throat or hoarseness - Damage to heart, brain, nerves, lungs, other parts of body or loss of life  Patient voiced understanding.)        Anesthesia Quick Evaluation

## 2020-07-10 NOTE — Op Note (Signed)
Preoperative diagnosis: Symptomatic right inguinal hernia.  Postoperative diagnosis: Same, small lipoma of the cord.    Operative procedure: Right indirect inguinal hernia repair with medium Bard PerFix plug and patch.  Operating surgeon: Hervey Ard, MD.  Anesthesia: General by LMA, Marcaine 0.5% with 1: 200,000's of epinephrine,: 30 cc; Toradol: 30 mg.  Estimated blood loss: Less than 2 cc.  Clinical note: This 50 year old male developed a increasingly symptomatic right inguinal hernia over the past 6 months.  He is admitted for elective repair.  He received Ancef 2 g intravenously prior to surgery.  Hair was removed from the surgical site prior to presentation to the operating theater.  SCD stockings for DVT prevention.  Operative note: With the patient under adequate general anesthesia the area was cleansed with ChloraPrep and draped.  A 5 cm skin line incision along the anticipated course the inguinal canal was made after field block anesthesia was established.  The skin was incised sharply and remaining dissection completed with electrocautery.  The external oblique was opened in the direction of its fibers.  The ilioinguinal iliohypogastric and genitofemoral nerves were identified and protected.  The cremasteric fibers were divided with cautery.  A small lipoma the cord was ligated and with 3-0 Vicryl tie and excised and discarded.  The cord was mobilized and a moderate sized indirect hernia sac was identified.  This was dissected free well into the preperitoneal space.  Palpation through the internal ring showed no weakness medially.  A Bard PerFix plug was placed through the internal ring and anchored to the iliopubic tract and the inferior aspect of the transverse abdominis aponeurosis to hold the mesh in place.  The onlay portion was then anchored to the pubic tubercle and then along the inguinal canal with interrupted 0 Surgilon sutures.  A lateral slit was closed with similar sutures.   The medial and superior aspects of the mesh were anchored to the transverse abdominis aponeurosis with interrupted 0 Surgilon sutures.  Toradol was placed on the wound.  The external oblique was closed with a running 2-0 Vicryl suture.  Scarpa's fascia was closed with a running 3-0 Vicryl.  Skin was closed with a running 4-0 Vicryl subcuticular suture.  Benzoin, Steri-Strips, Telfa and Tegaderm dressings were applied.  The patient tolerated the procedure well and was taken recovery in stable condition.

## 2020-07-12 ENCOUNTER — Encounter: Payer: Self-pay | Admitting: General Surgery

## 2020-07-27 ENCOUNTER — Other Ambulatory Visit: Payer: Self-pay

## 2020-08-27 ENCOUNTER — Other Ambulatory Visit: Payer: Self-pay

## 2020-08-27 MED ORDER — CARESTART COVID-19 HOME TEST VI KIT
PACK | 0 refills | Status: DC
  Filled 2020-08-27: qty 2, 4d supply, fill #0

## 2021-01-20 IMAGING — CT CT HEART MORP W/ CTA COR W/ SCORE W/ CA W/CM &/OR W/O CM
4 of 7 series · 8 of 20 positions shown, 9 images · IV contrast (APPLIED)
Comparison: None.
COMPARISON: None.

Addendum:
EXAM:
OVER-READ INTERPRETATION  CT CHEST

The following report is an over-read performed by radiologist Dr.
Javier Ramiro Yuu [[email or web address]] on 04/30/2019. This
over-read does not include interpretation of cardiac or coronary
anatomy or pathology. The coronary calcium score/coronary CTA
interpretation by the cardiologist is attached.
CLINICAL DATA: 48-year-old male with family history of premature
CAD and an episode of chest pain associated with syncope.
Cardiac/Coronary  CTA
TECHNIQUE: The patient was scanned on a [employer] scanner.

[Series 6: best diast 65 % · axial · 0.39mm/px · z∈[-178,-133]mm · 2 of 333 slices shown]
[im 111/333  vessel]
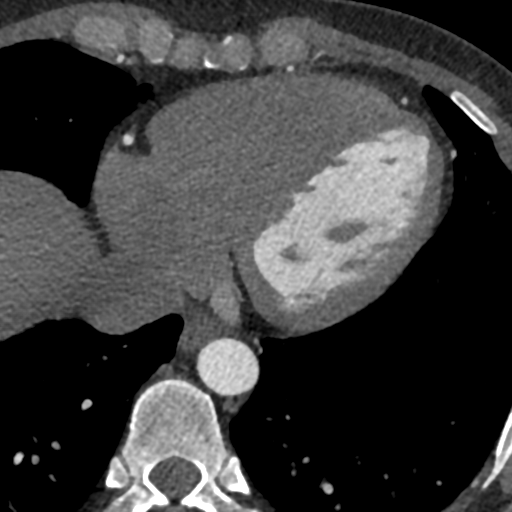
[im 222/333  vessel]
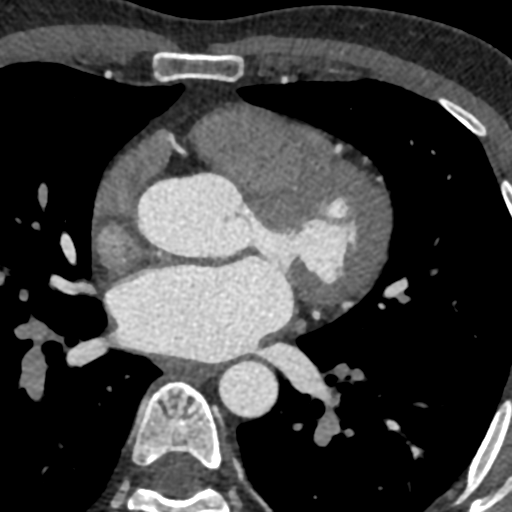

[Series 7: best syst · axial · 0.39mm/px · z∈[-178,-133]mm · 2 of 333 slices shown, 3 images]
[im 111/333  vessel]
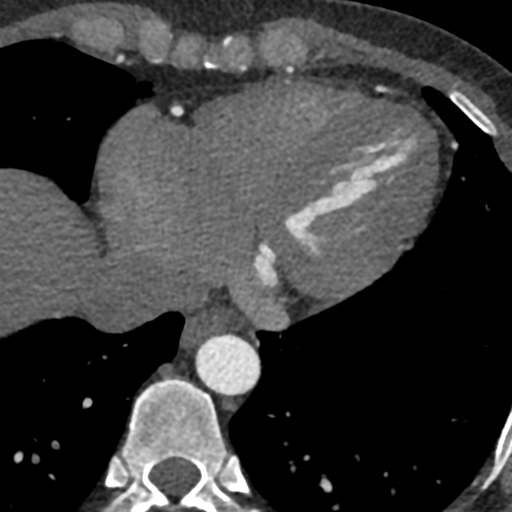
[im 111/333  lung]
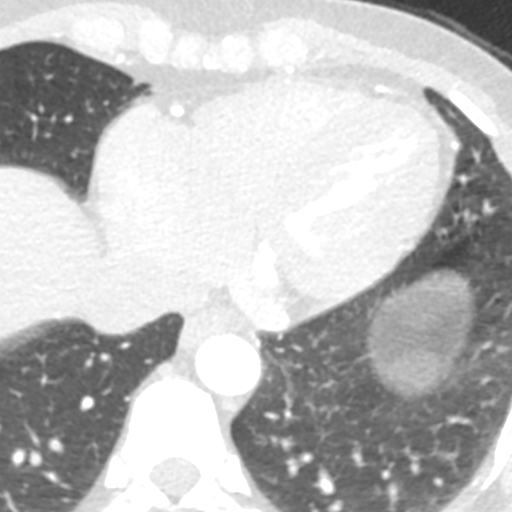
[im 222/333  vessel]
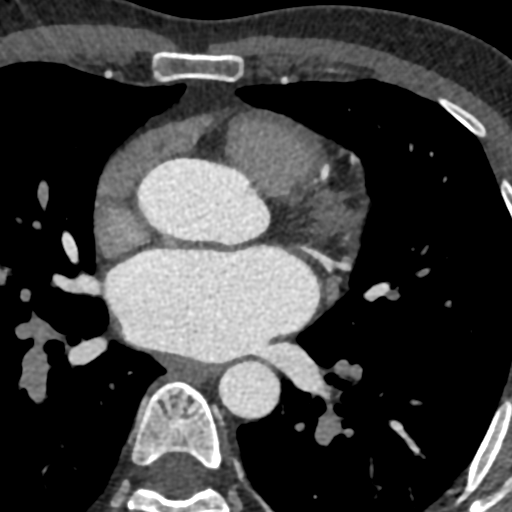

[Series 9: ts diast sharp · axial · 0.39mm/px · z∈[-178,-133]mm · 2 of 333 slices shown]
[im 111/333  lung]
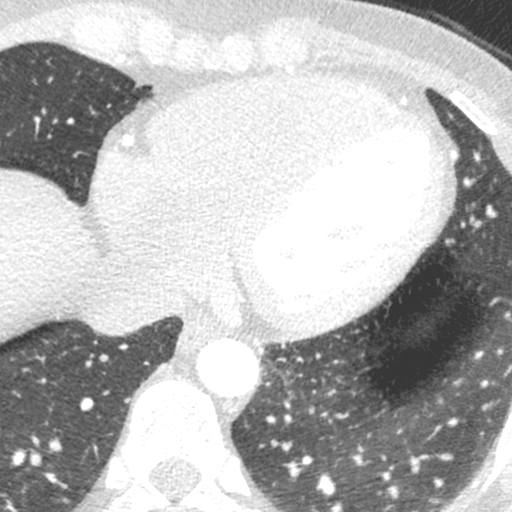
[im 222/333  lung]
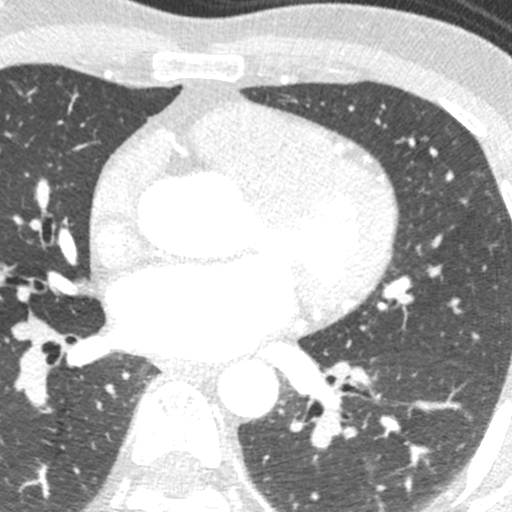

[Series 10: ts syst sharp · axial · 0.39mm/px · z∈[-178,-133]mm · 2 of 333 slices shown]
[im 111/333  lung]
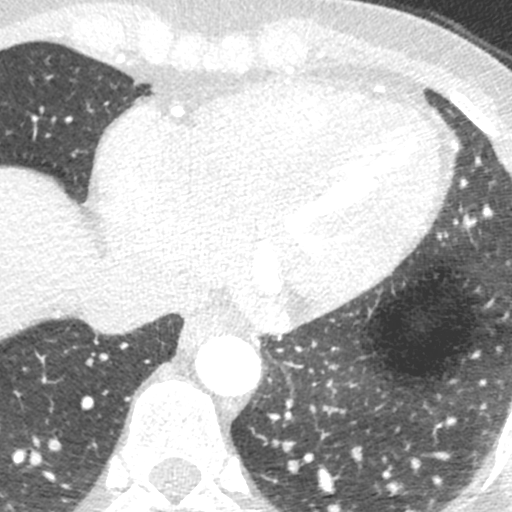
[im 222/333  lung]
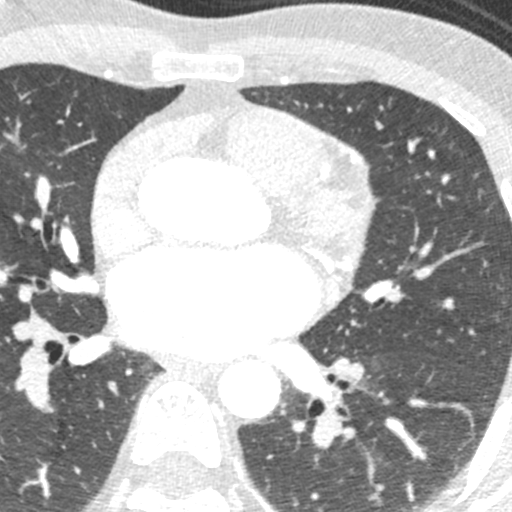

[8 of 20 positions shown; findings below may reference images not displayed]

FINDINGS: Several small 2-4 mm pulmonary nodules are noted in the lungs,
nonspecific, but statistically likely benign. Within the visualized
portions of the thorax there are no other larger more suspicious
appearing pulmonary nodules or masses, there is no acute
consolidative airspace disease, no pleural effusions, no
pneumothorax and no lymphadenopathy. Visualized portions of the
upper abdomen are unremarkable. There are no aggressive appearing
lytic or blastic lesions noted in the visualized portions of the
skeleton.
IMPRESSION: 1. Small pulmonary nodules measuring 2-4 mm in the lungs,
nonspecific, but statistically likely benign. No follow-up needed if
patient is low-risk (and has no known or suspected primary
neoplasm). Non-contrast chest CT can be considered in 12 months if
patient is high-risk. This recommendation follows the consensus
statement: Guidelines for Management of Incidental Pulmonary Nodules
Detected on CT Images: From the [HOSPITAL] 3689; Radiology
FINDINGS: A 100 kV prospective scan was triggered in the descending thoracic
aorta at 111 HU's. Axial non-contrast 3 mm slices were carried out
through the heart. The data set was analyzed on a dedicated work
station and scored using the Agatson method. Gantry rotation speed
was 250 msecs and collimation was .6 mm. No beta blockade and 0.8 mg
of sl NTG was given. The 3D data set was reconstructed in 5%
intervals of the 67-82 % of the R-R cycle. Diastolic phases were
analyzed on a dedicated work station using MPR, MIP and VRT modes.
The patient received 80 cc of contrast.

Aorta:  Normal size.  No calcifications.  No dissection.

Aortic Valve:  Trileaflet.  No calcifications.

Coronary Arteries:  Normal coronary origin.  Right dominance.

RCA is a large dominant artery that gives rise to PDA and PLA. There
is no plaque.

Left main is a large artery that gives rise to LAD and LCX arteries.
Left main has no plaque.

LAD is a large vessel that gives rise to one diagonal artery, wraps
around the apex and has no plaque.

LCX is a medium caliber non-dominant artery that gives rise to two
OM branches and has no plaque.

Other findings:

Normal pulmonary vein drainage into the left atrium.

Normal left atrial appendage without a thrombus.

Normal size of the pulmonary artery.
IMPRESSION: 1. Coronary calcium score of 0. This was 0 percentile for age and
sex matched control.

2. Normal coronary origin with right dominance.

3. CAD-RADS 0. No evidence of CAD (0%). Consider non-atherosclerotic
causes of chest pain.

*** End of Addendum ***
EXAM:
OVER-READ INTERPRETATION  CT CHEST

The following report is an over-read performed by radiologist Dr.
Javier Ramiro Yuu [REDACTED] on 04/30/2019. This
over-read does not include interpretation of cardiac or coronary
anatomy or pathology. The coronary calcium score/coronary CTA
interpretation by the cardiologist is attached.
FINDINGS: Several small 2-4 mm pulmonary nodules are noted in the lungs,
nonspecific, but statistically likely benign. Within the visualized
portions of the thorax there are no other larger more suspicious
appearing pulmonary nodules or masses, there is no acute
consolidative airspace disease, no pleural effusions, no
pneumothorax and no lymphadenopathy. Visualized portions of the
upper abdomen are unremarkable. There are no aggressive appearing
lytic or blastic lesions noted in the visualized portions of the
skeleton.
IMPRESSION: 1. Small pulmonary nodules measuring 2-4 mm in the lungs,
nonspecific, but statistically likely benign. No follow-up needed if
patient is low-risk (and has no known or suspected primary
neoplasm). Non-contrast chest CT can be considered in 12 months if
patient is high-risk. This recommendation follows the consensus
statement: Guidelines for Management of Incidental Pulmonary Nodules
Detected on CT Images: From the [HOSPITAL] 3689; Radiology

## 2021-02-15 ENCOUNTER — Other Ambulatory Visit: Payer: Self-pay

## 2021-02-15 ENCOUNTER — Ambulatory Visit
Admission: RE | Admit: 2021-02-15 | Discharge: 2021-02-15 | Disposition: A | Discharge status: Home / Self Care | Payer: 59 | Source: Ambulatory Visit | Attending: Emergency Medicine | Admitting: Emergency Medicine

## 2021-02-15 VITALS — BP 114/77 | HR 79 | Temp 98.7°F | Resp 16

## 2021-02-15 DIAGNOSIS — J0100 Acute maxillary sinusitis, unspecified: Secondary | ICD-10-CM

## 2021-02-15 LAB — POCT RAPID STREP A (OFFICE): Rapid Strep A Screen: NEGATIVE

## 2021-02-15 MED ORDER — PREDNISONE 10 MG (21) PO TBPK: ORAL_TABLET | Freq: Every day | ORAL | 0 refills | Status: DC

## 2021-02-15 MED ORDER — BENZONATATE 100 MG PO CAPS: 100.0000 mg | ORAL_CAPSULE | Freq: Three times a day (TID) | ORAL | 0 refills | Status: DC | PRN

## 2021-02-15 MED ORDER — AMOXICILLIN 875 MG PO TABS: 875.0000 mg | ORAL_TABLET | Freq: Two times a day (BID) | ORAL | 0 refills | Status: AC

## 2021-02-15 NOTE — ED Triage Notes (Signed)
Pt presents with a cough and ST x 3 weeks. Pt states he felt better for a few days and the symptoms returned 3 days ago. He now has bilateral ear pain and sinus pressure.

## 2021-02-15 NOTE — ED Provider Notes (Signed)
Joshua Stuart    CSN: [id number] Arrival date & time: 02/15/21  0076      History   Chief Complaint Chief Complaint  Patient presents with   Cough   Otalgia   Sore Throat    HPI Joshua Stuart is a 50 y.o. male.  Patient presents with history of sinus congestion, sore throat, nonproductive cough.  No fever, rash, shortness of breath, or other symptoms.  OTC treatment attempted at home.  The history is provided by the patient.   Past Medical History:  Diagnosis Date   Kidney stone 2019   Thalassemia minor    Vasovagal syncope     Patient Active Problem List   Diagnosis Date Noted   Chest pain    Syncope    Ureteral calculus 22/63/3354   Renal colic 56/25/6389    Past Surgical History:  Procedure Laterality Date   EXTRACORPOREAL SHOCK WAVE LITHOTRIPSY Right 01/05/2017   Procedure: EXTRACORPOREAL SHOCK WAVE LITHOTRIPSY (ESWL);  Surgeon: Hollice Espy, MD;  Location: [facility] ORS;  Service: Urology;  Laterality: Right;   INGUINAL HERNIA REPAIR Right 07/10/2020   Procedure: HERNIA REPAIR INGUINAL ADULT;  Surgeon: Robert Bellow, MD;  Location: [facility] ORS;  Service: General;  Laterality: Right;       Home Medications    Prior to Admission medications   Medication Sig Start Date End Date Taking? Authorizing Provider  amoxicillin (AMOXIL) 875 MG tablet Take 1 tablet (875 mg total) by mouth 2 (two) times daily for 10 days. 02/15/21 02/25/21 Yes Sharion Balloon, NP  benzonatate (TESSALON) 100 MG capsule Take 1 capsule (100 mg total) by mouth 3 (three) times daily as needed for cough. 02/15/21  Yes Sharion Balloon, NP  predniSONE (STERAPRED UNI-PAK 21 TAB) 10 MG (21) TBPK tablet Take by mouth daily. As directed 02/15/21  Yes Sharion Balloon, NP  acetaminophen (TYLENOL) 500 MG tablet Take 1,000 mg by mouth every 6 (six) hours as needed for moderate pain or headache.    [provider]  aspirin-acetaminophen-caffeine (EXCEDRIN MIGRAINE) [phone] MG tablet Take  2 tablets by mouth every 6 (six) hours as needed for headache.    [provider]  caffeine 200 MG TABS tablet Take 200 mg by mouth daily as needed (energy).    [provider]  COVID-19 At Home Antigen Test [facility] COVID-19 HOME TEST) KIT Use as directed 08/27/20   Letta Median, [facility]  HYDROcodone-acetaminophen (NORCO/VICODIN) 5-325 MG tablet Take 1 tablet by mouth every 4 (four) hours as needed for moderate pain. 07/10/20 07/10/21  Robert Bellow, MD    Family History Family History  Problem Relation Age of Onset   Coronary artery disease Father        s/p PCI   Coronary artery disease Paternal Uncle     Social History Social History   Tobacco Use   Smoking status: Never   Smokeless tobacco: Never  Vaping Use   Vaping Use: Never used  Substance Use Topics   Alcohol use: No   Drug use: No     Allergies   Patient has no known allergies.   Review of Systems Review of Systems  Constitutional:  Negative for chills and fever.  HENT:  Positive for congestion, ear pain and sore throat.   Respiratory:  Positive for cough. Negative for shortness of breath.   Cardiovascular:  Negative for chest pain and palpitations.  Gastrointestinal:  Negative for diarrhea and vomiting.  Skin:  Negative for color  change and rash.  All other systems reviewed and are negative.   Physical Exam Triage Vital Signs ED Triage Vitals  Enc Vitals Group     BP 02/15/21 0911 114/77     Pulse Rate 02/15/21 0911 79     Resp 02/15/21 0911 16     Temp 02/15/21 0911 98.7 F (37.1 C)     Temp Source 02/15/21 0911 Oral     SpO2 02/15/21 0911 97 %     Weight --      Height --      Head Circumference --      Peak Flow --      Pain Score 02/15/21 0908 4     Pain Loc --      Pain Edu? --      Excl. in [place]? --    No data found.  Updated Vital Signs BP 114/77 (BP Location: Left Arm)    Pulse 79    Temp 98.7 F (37.1 C) (Oral)    Resp 16    SpO2 97%   Visual Acuity Right  Eye Distance:   Left Eye Distance:   Bilateral Distance:    Right Eye Near:   Left Eye Near:    Bilateral Near:     Physical Exam Vitals and nursing note reviewed.  Constitutional:      General: He is not in acute distress.    Appearance: Normal appearance. He is well-developed.  HENT:     Right Ear: Tympanic membrane normal.     Left Ear: Tympanic membrane normal.     Nose: Congestion present.     Mouth/Throat:     Mouth: Mucous membranes are moist.     Pharynx: Oropharynx is clear.  Cardiovascular:     Rate and Rhythm: Normal rate and regular rhythm.     Heart sounds: Normal heart sounds.  Pulmonary:     Effort: Pulmonary effort is normal. No respiratory distress.     Breath sounds: Normal breath sounds.  Musculoskeletal:     Cervical back: Neck supple.  Skin:    General: Skin is warm and dry.  Neurological:     Mental Status: He is alert.  Psychiatric:        Mood and Affect: Mood normal.        Behavior: Behavior normal.     UC Treatments / Results  Labs (all labs ordered are listed, but only abnormal results are displayed) Labs Reviewed  POCT RAPID STREP A (OFFICE)    EKG   Radiology No results found.  Procedures Procedures (including critical care time)  Medications Ordered in UC Medications - No data to display  Initial Impression / Assessment and Plan / UC Course  I have reviewed the triage vital signs and the nursing notes.  Pertinent labs & imaging results that were available during my care of the patient were reviewed by me and considered in my medical decision making (see chart for details).  Acute sinusitis.  Patient has been symptomatic for 3 weeks.  Treating with amoxicillin, prednisone taper, Tessalon Perles.  Education provided on sinusitis.  Instructed patient to follow-up with his PCP if his symptoms are not improving.  He agrees to plan of care.   Final Clinical Impressions(s) / UC Diagnoses   Final diagnoses:  Acute  non-recurrent maxillary sinusitis     Discharge Instructions      Take the amoxicillin, prednisone, and Tessalon Perles as directed.  Follow up with your primary care provider  if your symptoms are not improving.        ED Prescriptions     Medication Sig Dispense Auth. Provider   amoxicillin (AMOXIL) 875 MG tablet Take 1 tablet (875 mg total) by mouth 2 (two) times daily for 10 days. 20 tablet Sharion Balloon, NP   predniSONE (STERAPRED UNI-PAK 21 TAB) 10 MG (21) TBPK tablet Take by mouth daily. As directed 21 tablet Sharion Balloon, NP   benzonatate (TESSALON) 100 MG capsule Take 1 capsule (100 mg total) by mouth 3 (three) times daily as needed for cough. 21 capsule Sharion Balloon, NP      PDMP not reviewed this encounter.   Sharion Balloon, NP 02/15/21 [phone]

## 2021-02-15 NOTE — Discharge Instructions (Addendum)
Take the amoxicillin, prednisone, and Tessalon Perles as directed.  Follow up with your primary care provider if your symptoms are not improving.

## 2021-03-02 ENCOUNTER — Other Ambulatory Visit: Payer: Self-pay

## 2021-03-02 MED ORDER — CARESTART COVID-19 HOME TEST VI KIT
PACK | 0 refills | Status: DC
  Filled 2021-03-02: qty 2, 4d supply, fill #0

## 2021-08-03 DIAGNOSIS — H5213 Myopia, bilateral: Secondary | ICD-10-CM | POA: Diagnosis not present

## 2021-08-03 DIAGNOSIS — H524 Presbyopia: Secondary | ICD-10-CM | POA: Diagnosis not present

## 2022-06-27 ENCOUNTER — Other Ambulatory Visit: Payer: Self-pay

## 2022-06-27 ENCOUNTER — Telehealth: Payer: Commercial Managed Care - PPO | Admitting: Physician Assistant

## 2022-06-27 DIAGNOSIS — J019 Acute sinusitis, unspecified: Secondary | ICD-10-CM

## 2022-06-27 DIAGNOSIS — B9689 Other specified bacterial agents as the cause of diseases classified elsewhere: Secondary | ICD-10-CM

## 2022-06-27 MED ORDER — PREDNISONE 10 MG PO TABS
ORAL_TABLET | ORAL | 0 refills | Status: AC
  Filled 2022-06-27 (×2): qty 21, 6d supply, fill #0

## 2022-06-27 MED ORDER — AMOXICILLIN-POT CLAVULANATE 875-125 MG PO TABS
1.0000 | ORAL_TABLET | Freq: Two times a day (BID) | ORAL | 0 refills | Status: DC
  Filled 2022-06-27 (×2): qty 14, 7d supply, fill #0

## 2022-06-27 NOTE — Progress Notes (Signed)
Virtual Visit Consent   [place] Ouch, you are scheduled for a virtual visit with a [place] provider today. Just as with appointments in the office, your consent must be obtained to participate. Your consent will be active for this visit and any virtual visit you may have with one of our providers in the next 365 days. If you have a MyChart account, a copy of this consent can be sent to you electronically.  As this is a virtual visit, video technology does not allow for your provider to perform a traditional examination. This may limit your provider's ability to fully assess your condition. If your provider identifies any concerns that need to be evaluated in person or the need to arrange testing (such as labs, EKG, etc.), we will make arrangements to do so. Although advances in technology are sophisticated, we cannot ensure that it will always work on either your end or our end. If the connection with a video visit is poor, the visit may have to be switched to a telephone visit. With either a video or telephone visit, we are not always able to ensure that we have a secure connection.  By engaging in this virtual visit, you consent to the provision of healthcare and authorize for your insurance to be billed (if applicable) for the services provided during this visit. Depending on your insurance coverage, you may receive a charge related to this service.  I need to obtain your verbal consent now. Are you willing to proceed with your visit today? Joshua Stuart has provided verbal consent on 06/27/2022 for a virtual visit (video or telephone). Margaretann Loveless, PA-C  Date: 06/27/2022 5:44 PM  Virtual Visit via Video Note   I, Margaretann Loveless, connected with  Joshua Stuart  ([id number], 24-Jan-1971) on 06/27/22 at  5:45 PM EDT by a video-enabled telemedicine application and verified that I am speaking with the correct person using two identifiers.  Location: Patient: Virtual Visit  Location Patient: Mobile Provider: Virtual Visit Location Provider: Home Office   I discussed the limitations of evaluation and management by telemedicine and the availability of in person appointments. The patient expressed understanding and agreed to proceed.    History of Present Illness: Joshua Stuart is a 52 y.o. who identifies as a male who was assigned male at birth, and is being seen today for possible sinus infection.  HPI: Sinusitis This is a new problem. The current episode started 1 to 4 weeks ago (10 days). The problem has been gradually worsening since onset. There has been no fever. He is experiencing no pain. Associated symptoms include chills, congestion, headaches, sinus pressure and a sore throat. Pertinent negatives include no coughing, diaphoresis, ear pain, hoarse voice, neck pain or sneezing. Treatments tried: tylenol. The treatment provided no relief.     Problems:  Patient Active Problem List   Diagnosis Date Noted   Chest pain    Syncope    Ureteral calculus 01/04/2017   Renal colic 01/04/2017    Allergies: No Known Allergies Medications:  Current Outpatient Medications:    acetaminophen (TYLENOL) 500 MG tablet, Take 1,000 mg by mouth every 6 (six) hours as needed for moderate pain or headache., Disp: , Rfl:    amoxicillin-clavulanate (AUGMENTIN) 875-125 MG tablet, Take 1 tablet by mouth 2 (two) times daily., Disp: 14 tablet, Rfl: 0   aspirin-acetaminophen-caffeine (EXCEDRIN MIGRAINE) 250-250-65 MG tablet, Take 2 tablets by mouth every 6 (six) hours as needed for headache., Disp: ,  Rfl:    benzonatate (TESSALON) 100 MG capsule, Take 1 capsule (100 mg total) by mouth 3 (three) times daily as needed for cough., Disp: 21 capsule, Rfl: 0   caffeine 200 MG TABS tablet, Take 200 mg by mouth daily as needed (energy)., Disp: , Rfl:    COVID-19 At Home Antigen Test ([facility] COVID-19 HOME TEST) KIT, Use as directed, Disp: 2 kit, Rfl: 0   COVID-19 At Home Antigen Test  ([facility] COVID-19 HOME TEST) KIT, Use as directed, Disp: 2 kit, Rfl: 0   predniSONE (STERAPRED UNI-PAK 21 TAB) 10 MG (21) TBPK tablet, Take by mouth daily. As directed, Disp: 21 tablet, Rfl: 0  Observations/Objective: Patient is well-developed, well-nourished in no acute distress.  Resting comfortably Head is normocephalic, atraumatic.  No labored breathing.  Speech is clear and coherent with logical content.  Patient is alert and oriented at baseline.    Assessment and Plan: 1. Acute bacterial sinusitis - amoxicillin-clavulanate (AUGMENTIN) 875-125 MG tablet; Take 1 tablet by mouth 2 (two) times daily.  Dispense: 14 tablet; Refill: 0 - predniSONE (STERAPRED UNI-PAK 21 TAB) 10 MG (21) TBPK tablet; Take by mouth daily. As directed  Dispense: 21 tablet; Refill: 0  - Worsening symptoms that have not responded to OTC medications.  - Will give Augmentin and Prednisone - Continue allergy medications.  - Steam and humidifier can help - Stay well hydrated and get plenty of rest.  - Seek in person evaluation if no symptom improvement or if symptoms worsen   Follow Up Instructions: I discussed the assessment and treatment plan with the patient. The patient was provided an opportunity to ask questions and all were answered. The patient agreed with the plan and demonstrated an understanding of the instructions.  A copy of instructions were sent to the patient via MyChart unless otherwise noted below.    The patient was advised to call back or seek an in-person evaluation if the symptoms worsen or if the condition fails to improve as anticipated.  Time:  I spent 8 minutes with the patient via telehealth technology discussing the above problems/concerns.    Margaretann Loveless, PA-C

## 2022-06-27 NOTE — Patient Instructions (Signed)
Joshua Stuart, thank you for joining Margaretann Loveless, PA-C for today's virtual visit.  While this provider is not your primary care provider (PCP), if your PCP is located in our provider database this encounter information will be shared with them immediately following your visit.   A [facility] MyChart account gives you access to today's visit and all your visits, tests, and labs performed at [facility] " click here if you don't have a [facility] MyChart account or go to [email or web address]  Consent: (Patient) Joshua Stuart provided verbal consent for this virtual visit at the beginning of the encounter.  Current Medications:  Current Outpatient Medications:    acetaminophen (TYLENOL) 500 MG tablet, Take 1,000 mg by mouth every 6 (six) hours as needed for moderate pain or headache., Disp: , Rfl:    amoxicillin-clavulanate (AUGMENTIN) 875-125 MG tablet, Take 1 tablet by mouth 2 (two) times daily., Disp: 14 tablet, Rfl: 0   aspirin-acetaminophen-caffeine (EXCEDRIN MIGRAINE) 250-250-65 MG tablet, Take 2 tablets by mouth every 6 (six) hours as needed for headache., Disp: , Rfl:    benzonatate (TESSALON) 100 MG capsule, Take 1 capsule (100 mg total) by mouth 3 (three) times daily as needed for cough., Disp: 21 capsule, Rfl: 0   caffeine 200 MG TABS tablet, Take 200 mg by mouth daily as needed (energy)., Disp: , Rfl:    COVID-19 At Home Antigen Test (CARESTART COVID-19 HOME TEST) KIT, Use as directed, Disp: 2 kit, Rfl: 0   COVID-19 At Home Antigen Test (CARESTART COVID-19 HOME TEST) KIT, Use as directed, Disp: 2 kit, Rfl: 0   predniSONE (STERAPRED UNI-PAK 21 TAB) 10 MG (21) TBPK tablet, Take by mouth daily. As directed, Disp: 21 tablet, Rfl: 0   Medications ordered in this encounter:  Meds ordered this encounter  Medications   amoxicillin-clavulanate (AUGMENTIN) 875-125 MG tablet    Sig: Take 1 tablet by mouth 2 (two) times daily.    Dispense:  14 tablet     Refill:  0    Order Specific Question:   Supervising Provider    Answer:   Merrilee Jansky [[id number]]   predniSONE (STERAPRED UNI-PAK 21 TAB) 10 MG (21) TBPK tablet    Sig: Take by mouth daily. As directed    Dispense:  21 tablet    Refill:  0    Order Specific Question:   Supervising Provider    Answer:   Merrilee Jansky [[id number]]     *If you need refills on other medications prior to your next appointment, please contact your pharmacy*  Follow-Up: Call back or seek an in-person evaluation if the symptoms worsen or if the condition fails to improve as anticipated.  [facility] [phone]  Other Instructions  Sinus Infection, Adult A sinus infection, also called sinusitis, is inflammation of your sinuses. Sinuses are hollow spaces in the bones around your face. Your sinuses are located: Around your eyes. In the middle of your forehead. Behind your nose. In your cheekbones. Mucus normally drains out of your sinuses. When your nasal tissues become inflamed or swollen, mucus can become trapped or blocked. This allows bacteria, viruses, and fungi to grow, which leads to infection. Most infections of the sinuses are caused by a virus. A sinus infection can develop quickly. It can last for up to 4 weeks (acute) or for more than 12 weeks (chronic). A sinus infection often develops after a cold. What are the causes? This condition is caused by anything  that creates swelling in the sinuses or stops mucus from draining. This includes: Allergies. Asthma. Infection from bacteria or viruses. Deformities or blockages in your nose or sinuses. Abnormal growths in the nose (nasal polyps). Pollutants, such as chemicals or irritants in the air. Infection from fungi. This is rare. What increases the risk? You are more likely to develop this condition if you: Have a weak body defense system (immune system). Do a lot of swimming or diving. Overuse nasal sprays. Smoke. What  are the signs or symptoms? The main symptoms of this condition are pain and a feeling of pressure around the affected sinuses. Other symptoms include: Stuffy nose or congestion that makes it difficult to breathe through your nose. Thick yellow or greenish drainage from your nose. Tenderness, swelling, and warmth over the affected sinuses. A cough that may get worse at night. Decreased sense of smell and taste. Extra mucus that collects in the throat or the back of the nose (postnasal drip) causing a sore throat or bad breath. Tiredness (fatigue). Fever. How is this diagnosed? This condition is diagnosed based on: Your symptoms. Your medical history. A physical exam. Tests to find out if your condition is acute or chronic. This may include: Checking your nose for nasal polyps. Viewing your sinuses using a device that has a light (endoscope). Testing for allergies or bacteria. Imaging tests, such as an MRI or CT scan. In rare cases, a bone biopsy may be done to rule out more serious types of fungal sinus disease. How is this treated? Treatment for a sinus infection depends on the cause and whether your condition is chronic or acute. If caused by a virus, your symptoms should go away on their own within 10 days. You may be given medicines to relieve symptoms. They include: Medicines that shrink swollen nasal passages (decongestants). A spray that eases inflammation of the nostrils (topical intranasal corticosteroids). Rinses that help get rid of thick mucus in your nose (nasal saline washes). Medicines that treat allergies (antihistamines). Over-the-counter pain relievers. If caused by bacteria, your health care provider may recommend waiting to see if your symptoms improve. Most bacterial infections will get better without antibiotic medicine. You may be given antibiotics if you have: A severe infection. A weak immune system. If caused by narrow nasal passages or nasal polyps, surgery  may be needed. Follow these instructions at home: Medicines Take, use, or apply over-the-counter and prescription medicines only as told by your health care provider. These may include nasal sprays. If you were prescribed an antibiotic medicine, take it as told by your health care provider. Do not stop taking the antibiotic even if you start to feel better. Hydrate and humidify  Drink enough fluid to keep your urine pale yellow. Staying hydrated will help to thin your mucus. Use a cool mist humidifier to keep the humidity level in your home above 50%. Inhale steam for 10-15 minutes, 3-4 times a day, or as told by your health care provider. You can do this in the bathroom while a hot shower is running. Limit your exposure to cool or dry air. Rest Rest as much as possible. Sleep with your head raised (elevated). Make sure you get enough sleep each night. General instructions  Apply a warm, moist washcloth to your face 3-4 times a day or as told by your health care provider. This will help with discomfort. Use nasal saline washes as often as told by your health care provider. Wash your hands often with soap and  water to reduce your exposure to germs. If soap and water are not available, use hand sanitizer. Do not smoke. Avoid being around people who are smoking (secondhand smoke). Keep all follow-up visits. This is important. Contact a health care provider if: You have a fever. Your symptoms get worse. Your symptoms do not improve within 10 days. Get help right away if: You have a severe headache. You have persistent vomiting. You have severe pain or swelling around your face or eyes. You have vision problems. You develop confusion. Your neck is stiff. You have trouble breathing. These symptoms may be an emergency. Get help right away. Call 911. Do not wait to see if the symptoms will go away. Do not drive yourself to the hospital. Summary A sinus infection is soreness and  inflammation of your sinuses. Sinuses are hollow spaces in the bones around your face. This condition is caused by nasal tissues that become inflamed or swollen. The swelling traps or blocks the flow of mucus. This allows bacteria, viruses, and fungi to grow, which leads to infection. If you were prescribed an antibiotic medicine, take it as told by your health care provider. Do not stop taking the antibiotic even if you start to feel better. Keep all follow-up visits. This is important. This information is not intended to replace advice given to you by your health care provider. Make sure you discuss any questions you have with your health care provider. Document Revised: 01/12/2021 Document Reviewed: 01/12/2021 Elsevier Patient Education  2023 Elsevier Inc.    If you have been instructed to have an in-person evaluation today at a local Urgent Care facility, please use the link below. It will take you to a list of all of our available [facility], including address, phone number and hours of operation. Please do not delay care.  [facility]  If you or a family member do not have a primary care provider, use the link below to schedule a visit and establish care. When you choose a [facility] primary care physician or advanced practice provider, you gain a long-term partner in health. Find a Primary Care Provider  Learn more about [facility]'s in-office and virtual care options: [facility] - Get Care Now

## 2022-12-05 ENCOUNTER — Other Ambulatory Visit: Payer: Self-pay

## 2022-12-05 DIAGNOSIS — Z1211 Encounter for screening for malignant neoplasm of colon: Secondary | ICD-10-CM

## 2022-12-05 MED ORDER — NA SULFATE-K SULFATE-MG SULF 17.5-3.13-1.6 GM/177ML PO SOLN
1.0000 | Freq: Once | ORAL | 0 refills | Status: AC
  Filled 2022-12-05 – 2022-12-22 (×2): qty 354, 1d supply, fill #0

## 2022-12-15 ENCOUNTER — Other Ambulatory Visit: Payer: Self-pay

## 2022-12-21 ENCOUNTER — Encounter: Payer: Self-pay | Admitting: Gastroenterology

## 2022-12-22 ENCOUNTER — Other Ambulatory Visit: Payer: Self-pay

## 2022-12-30 ENCOUNTER — Encounter
Admission: RE | Disposition: A | Discharge status: Home / Self Care | Payer: Self-pay | Source: Home / Self Care | Attending: Gastroenterology

## 2022-12-30 ENCOUNTER — Ambulatory Visit: Payer: Commercial Managed Care - PPO | Admitting: General Practice

## 2022-12-30 ENCOUNTER — Encounter: Payer: Self-pay | Admitting: Gastroenterology

## 2022-12-30 ENCOUNTER — Other Ambulatory Visit: Payer: Self-pay

## 2022-12-30 ENCOUNTER — Ambulatory Visit
Admission: RE | Admit: 2022-12-30 | Discharge: 2022-12-30 | Disposition: A | Discharge status: Home / Self Care | Payer: Commercial Managed Care - PPO | Attending: Gastroenterology | Admitting: Gastroenterology

## 2022-12-30 DIAGNOSIS — K621 Rectal polyp: Secondary | ICD-10-CM | POA: Diagnosis not present

## 2022-12-30 DIAGNOSIS — D124 Benign neoplasm of descending colon: Secondary | ICD-10-CM | POA: Diagnosis not present

## 2022-12-30 DIAGNOSIS — K640 First degree hemorrhoids: Secondary | ICD-10-CM | POA: Insufficient documentation

## 2022-12-30 DIAGNOSIS — Z1211 Encounter for screening for malignant neoplasm of colon: Secondary | ICD-10-CM | POA: Insufficient documentation

## 2022-12-30 HISTORY — PX: POLYPECTOMY: SHX5525

## 2022-12-30 HISTORY — PX: COLONOSCOPY WITH PROPOFOL: SHX5780

## 2022-12-30 HISTORY — DX: Thalassemia minor: D56.3

## 2022-12-30 HISTORY — DX: Personal history of urinary calculi: Z87.442

## 2022-12-30 SURGERY — COLONOSCOPY WITH PROPOFOL
Anesthesia: General | Site: Rectum

## 2022-12-30 MED ORDER — PROPOFOL 10 MG/ML IV BOLUS
INTRAVENOUS | Status: DC | PRN
  Administered 2022-12-30: 50 mg via INTRAVENOUS
  Administered 2022-12-30 (×2): 20 mg via INTRAVENOUS
  Administered 2022-12-30: 30 mg via INTRAVENOUS
  Administered 2022-12-30: 20 mg via INTRAVENOUS
  Administered 2022-12-30: 30 mg via INTRAVENOUS
  Administered 2022-12-30: 100 mg via INTRAVENOUS
  Administered 2022-12-30: 20 mg via INTRAVENOUS

## 2022-12-30 MED ORDER — LIDOCAINE HCL (CARDIAC) PF 100 MG/5ML IV SOSY
PREFILLED_SYRINGE | INTRAVENOUS | Status: DC | PRN
  Administered 2022-12-30: 40 mg via INTRAVENOUS

## 2022-12-30 MED ORDER — SODIUM CHLORIDE 0.9 % IV SOLN: INTRAVENOUS | Status: DC

## 2022-12-30 MED ORDER — STERILE WATER FOR IRRIGATION IR SOLN
Status: DC | PRN
  Administered 2022-12-30: 1

## 2022-12-30 MED ORDER — SODIUM CHLORIDE 0.9% FLUSH
INTRAVENOUS | Status: DC | PRN
  Administered 2022-12-30: 10 mL via INTRAVENOUS

## 2022-12-30 SURGICAL SUPPLY — 21 items

## 2022-12-30 NOTE — Transfer of Care (Signed)
Immediate Anesthesia Transfer of Care Note  Patient: Joshua [facility]  Procedure(s) Performed: COLONOSCOPY WITH PROPOFOL (Rectum) POLYPECTOMY (Rectum)  Patient Location: PACU  Anesthesia Type: General  Level of Consciousness: awake, alert  and patient cooperative  Airway and Oxygen Therapy: Patient Spontanous Breathing and Patient connected to supplemental oxygen  Post-op Assessment: Post-op Vital signs reviewed, Patient's Cardiovascular Status Stable, Respiratory Function Stable, Patent Airway and No signs of Nausea or vomiting  Post-op Vital Signs: Reviewed and stable  Complications: No notable events documented.

## 2022-12-30 NOTE — Anesthesia Postprocedure Evaluation (Signed)
Anesthesia Post Note  Patient: Joshua [facility]  Procedure(s) Performed: COLONOSCOPY WITH PROPOFOL (Rectum) POLYPECTOMY (Rectum)  Patient location during evaluation: PACU Anesthesia Type: General Level of consciousness: awake and alert Pain management: pain level controlled Vital Signs Assessment: post-procedure vital signs reviewed and stable Respiratory status: spontaneous breathing, nonlabored ventilation, respiratory function stable and patient connected to nasal cannula oxygen Cardiovascular status: blood pressure returned to baseline and stable Postop Assessment: no apparent nausea or vomiting Anesthetic complications: no   No notable events documented.   Last Vitals:  Vitals:   12/30/22 1228 12/30/22 1240  BP: 97/74 106/76  Pulse: 79 67  Resp: 18 14  Temp: (!) 36.4 C (!) 36.4 C  SpO2: 96% 98%    Last Pain:  Vitals:   12/30/22 1240  TempSrc:   PainSc: 0-No pain                 Densil Ottey C Amana Bouska

## 2022-12-30 NOTE — Anesthesia Preprocedure Evaluation (Addendum)
Anesthesia Evaluation  Patient identified by MRN, date of birth, ID band Patient awake    Reviewed: Allergy & Precautions, H&P , NPO status , Patient's Chart, lab work & pertinent test results  Airway Mallampati: I  TM Distance: >3 FB Neck ROM: Full    Dental no notable dental hx.    Pulmonary neg pulmonary ROS   Pulmonary exam normal breath sounds clear to auscultation       Cardiovascular negative cardio ROS Normal cardiovascular exam Rhythm:Regular Rate:Normal     Neuro/Psych negative neurological ROS  negative psych ROS   GI/Hepatic negative GI ROS, Neg liver ROS,,,  Endo/Other  negative endocrine ROS    Renal/GU negative Renal ROS  negative genitourinary   Musculoskeletal negative musculoskeletal ROS (+)    Abdominal   Peds negative pediatric ROS (+)  Hematology negative hematology ROS (+) Blood dyscrasia, anemia   Anesthesia Other Findings Beta Thalassemia minor Vasovagal  History of kidney stones   Reproductive/Obstetrics negative OB ROS                             Anesthesia Physical Anesthesia Plan  ASA: 2  Anesthesia Plan: General   Post-op Pain Management:    Induction: Intravenous  PONV Risk Score and Plan:   Airway Management Planned: Natural Airway and Nasal Cannula  Additional Equipment:   Intra-op Plan:   Post-operative Plan:   Informed Consent: I have reviewed the patients History and Physical, chart, labs and discussed the procedure including the risks, benefits and alternatives for the proposed anesthesia with the patient or authorized representative who has indicated his/her understanding and acceptance.     Dental Advisory Given  Plan Discussed with: Anesthesiologist, CRNA and Surgeon  Anesthesia Plan Comments: (Patient consented for risks of anesthesia including but not limited to:  - adverse reactions to medications - risk of airway  placement if required - damage to eyes, teeth, lips or other oral mucosa - nerve damage due to positioning  - sore throat or hoarseness - Damage to heart, brain, nerves, lungs, other parts of body or loss of life  Patient voiced understanding and assent.)       Anesthesia Quick Evaluation

## 2022-12-30 NOTE — Op Note (Signed)
[facility]astroenter[facility] Patient Name: Joshua Stuart Procedure Date: 12/30/2022 8:02 AM MRN: [id number] Account #: [id number] Date of Birth: 09/16/1970 Admit Type: Outpatient Age: 52 Room: [facility] OR ROOM 01 Gender: Male Note Status: Finalized Instrument Name: [id number] Procedure:             Colonoscopy Indications:           Screening for colorectal malignant neoplasm Providers:             Midge Minium MD, MD Medicines:             Propofol per Anesthesia Complications:         No immediate complications. Procedure:             Pre-Anesthesia Assessment:                        - Prior to the procedure, a History and Physical was                         performed, and patient medications and allergies were                         reviewed. The patient's tolerance of previous                         anesthesia was also reviewed. The risks and benefits                         of the procedure and the sedation options and risks                         were discussed with the patient. All questions were                         answered, and informed consent was obtained. Prior                         Anticoagulants: The patient has taken no anticoagulant                         or antiplatelet agents. ASA Grade Assessment: II - A                         patient with mild systemic disease. After reviewing                         the risks and benefits, the patient was deemed in                         satisfactory condition to undergo the procedure.                        After obtaining informed consent, the colonoscope was                         passed under direct vision. Throughout the procedure,                         the patient's blood pressure,  pulse, and oxygen                         saturations were monitored continuously. The                         Colonoscope was introduced through the anus and                         advanced to the the cecum,  identified by appendiceal                         orifice and ileocecal valve. The colonoscopy was                         performed without difficulty. The patient tolerated                         the procedure well. The quality of the bowel                         preparation was excellent. Findings:      The perianal and digital rectal examinations were normal.      A 4 mm polyp was found in the descending colon. The polyp was       pedunculated. The polyp was removed with a cold snare. Resection and       retrieval were complete.      Two sessile polyps were found in the descending colon. The polyps were 4       to 5 mm in size. These polyps were removed with a cold snare. Resection       and retrieval were complete.      Non-bleeding internal hemorrhoids were found during retroflexion. The       hemorrhoids were Grade I (internal hemorrhoids that do not prolapse). Impression:            - One 4 mm polyp in the descending colon, removed with                         a cold snare. Resected and retrieved.                        - Two 4 to 5 mm polyps in the descending colon,                         removed with a cold snare. Resected and retrieved.                        - Non-bleeding internal hemorrhoids. Recommendation:        - Discharge patient to home.                        - Resume previous diet.                        - Continue present medications.                        - Await pathology results.                        -  If the pathology report reveals adenomatous tissue,                         then repeat the colonoscopy for surveillance in 5                         years. Procedure Code(s):     --- Professional ---                        [phone], Colonoscopy, flexible; with removal of                         tumor(s), polyp(s), or other lesion(s) by snare                         technique Diagnosis Code(s):     --- Professional ---                        Z12.11,  Encounter for screening for malignant neoplasm                         of colon                        D12.4, Benign neoplasm of descending colon CPT copyright 2022 American Medical Association. All rights reserved. The codes documented in this report are preliminary and upon coder review may  be revised to meet current compliance requirements. Midge Minium MD, MD 12/30/2022 12:27:00 PM This report has been signed electronically. Number of Addenda: 0 Note Initiated On: 12/30/2022 8:02 AM Scope Withdrawal Time: 0 hours 9 minutes 52 seconds  Total Procedure Duration: 0 hours 15 minutes 43 seconds  Estimated Blood Loss:  Estimated blood loss: none.      [facility]

## 2022-12-30 NOTE — H&P (Signed)
Midge Minium, MD [facility] [address], [address] [place], [place] [address] Phone: [phone] Fax : [phone]  Primary Care Physician:  Pcp, No Primary Gastroenterologist:  Dr. Servando Snare  Pre-Procedure History & Physical: HPI:  Joshua Stuart is a 52 y.o. male is here for a screening colonoscopy.   Past Medical History:  Diagnosis Date   Beta thalassemia minor    History of kidney stones    Thalassemia minor    Vasovagal syncope     Past Surgical History:  Procedure Laterality Date   EXTRACORPOREAL SHOCK WAVE LITHOTRIPSY Right 01/05/2017   Procedure: EXTRACORPOREAL SHOCK WAVE LITHOTRIPSY (ESWL);  Surgeon: Vanna Scotland, MD;  Location: [facility] ORS;  Service: Urology;  Laterality: Right;   INGUINAL HERNIA REPAIR Right 07/10/2020   Procedure: HERNIA REPAIR INGUINAL ADULT;  Surgeon: Earline Mayotte, MD;  Location: [facility] ORS;  Service: General;  Laterality: Right;    Prior to Admission medications   Medication Sig Start Date End Date Taking? Authorizing Provider  acetaminophen (TYLENOL) 500 MG tablet Take 1,000 mg by mouth every 6 (six) hours as needed for moderate pain or headache.   Yes [provider]  aspirin-acetaminophen-caffeine (EXCEDRIN MIGRAINE) [phone] MG tablet Take 2 tablets by mouth every 6 (six) hours as needed for headache.   Yes [provider]  caffeine 200 MG TABS tablet Take 200 mg by mouth daily as needed (energy).   Yes [provider]    Allergies as of 12/05/2022   (No Known Allergies)    Family History  Problem Relation Age of Onset   Coronary artery disease Father        s/p PCI   Coronary artery disease Paternal Uncle     Social History   Socioeconomic History   Marital status: Married    Spouse name: Not on file   Number of children: Not on file   Years of education: Not on file   Highest education level: Not on file  Occupational History   Not on file  Tobacco Use   Smoking status: Never   Smokeless  tobacco: Never  Vaping Use   Vaping status: Never Used  Substance and Sexual Activity   Alcohol use: No   Drug use: No   Sexual activity: Not on file  Other Topics Concern   Not on file  Social History Narrative   Not on file   Social Determinants of Health   Financial Resource Strain: Not on file  Food Insecurity: Not on file  Transportation Needs: Not on file  Physical Activity: Not on file  Stress: Not on file  Social Connections: Not on file  Intimate Partner Violence: Not on file    Review of Systems: See HPI, otherwise negative ROS  Physical Exam: BP 127/87   Pulse 66   Temp 98 F (36.7 C) (Temporal)   Resp 15   Ht 6\' 1"  (1.854 m)   Wt 89.4 kg   SpO2 99%   BMI 26.02 kg/m  General:   Alert,  pleasant and cooperative in NAD Head:  Normocephalic and atraumatic. Neck:  Supple; no masses or thyromegaly. Lungs:  Clear throughout to auscultation.    Heart:  Regular rate and rhythm. Abdomen:  Soft, nontender and nondistended. Normal bowel sounds, without guarding, and without rebound.   Neurologic:  Alert and  oriented x4;  grossly normal neurologically.  Impression/Plan: Joshua Stuart is now here to undergo a screening colonoscopy.  Risks, benefits, and alternatives regarding colonoscopy have been reviewed  with the patient.  Questions have been answered.  All parties agreeable.

## 2022-12-31 ENCOUNTER — Encounter: Payer: Self-pay | Admitting: Gastroenterology

## 2023-01-02 ENCOUNTER — Encounter: Payer: Self-pay | Admitting: Gastroenterology

## 2023-01-02 LAB — SURGICAL PATHOLOGY

## 2023-05-04 ENCOUNTER — Other Ambulatory Visit: Payer: Self-pay | Admitting: Medical Genetics

## 2023-05-25 ENCOUNTER — Other Ambulatory Visit

## 2023-05-25 ENCOUNTER — Other Ambulatory Visit: Payer: Self-pay | Admitting: Medical Genetics

## 2023-05-25 DIAGNOSIS — Z006 Encounter for examination for normal comparison and control in clinical research program: Secondary | ICD-10-CM

## 2023-05-31 ENCOUNTER — Other Ambulatory Visit: Payer: Self-pay | Admitting: Medical Genetics

## 2023-05-31 DIAGNOSIS — Z006 Encounter for examination for normal comparison and control in clinical research program: Secondary | ICD-10-CM

## 2023-06-06 LAB — GENECONNECT MOLECULAR SCREEN: Genetic Analysis Overall Interpretation: NEGATIVE

## 2023-08-03 ENCOUNTER — Other Ambulatory Visit: Payer: Self-pay

## 2023-10-25 NOTE — Addendum Note (Signed)
 Addended by: KOWACICH-SWANEY, DANNIELLE J on: 10/25/2023 01:02 PM   Modules accepted: Orders

## 2023-12-18 ENCOUNTER — Other Ambulatory Visit: Payer: Self-pay | Admitting: Infectious Diseases

## 2023-12-18 ENCOUNTER — Other Ambulatory Visit: Payer: Self-pay

## 2023-12-18 ENCOUNTER — Other Ambulatory Visit
Admission: RE | Admit: 2023-12-18 | Discharge: 2023-12-18 | Disposition: A | Discharge status: Home / Self Care | Source: Ambulatory Visit | Attending: Cardiovascular Disease | Admitting: Cardiovascular Disease

## 2023-12-18 DIAGNOSIS — L0291 Cutaneous abscess, unspecified: Secondary | ICD-10-CM

## 2023-12-18 MED ORDER — SULFAMETHOXAZOLE-TRIMETHOPRIM 800-160 MG PO TABS
1.0000 | ORAL_TABLET | Freq: Two times a day (BID) | ORAL | 0 refills | Status: DC
  Filled 2023-12-18: qty 14, 7d supply, fill #0

## 2023-12-23 LAB — AEROBIC/ANAEROBIC CULTURE W GRAM STAIN (SURGICAL/DEEP WOUND)
Culture: NORMAL
Gram Stain: NONE SEEN

## 2024-01-08 ENCOUNTER — Other Ambulatory Visit (HOSPITAL_COMMUNITY): Payer: Self-pay

## 2024-01-23 ENCOUNTER — Encounter: Payer: Self-pay | Admitting: Physician Assistant

## 2024-01-23 ENCOUNTER — Telehealth: Admitting: Physician Assistant

## 2024-01-23 ENCOUNTER — Other Ambulatory Visit: Payer: Self-pay

## 2024-01-23 DIAGNOSIS — J208 Acute bronchitis due to other specified organisms: Secondary | ICD-10-CM | POA: Diagnosis not present

## 2024-01-23 MED ORDER — IPRATROPIUM BROMIDE 0.03 % NA SOLN
2.0000 | Freq: Two times a day (BID) | NASAL | 0 refills | Status: AC
  Filled 2024-01-23: qty 30, 75d supply, fill #0

## 2024-01-23 MED ORDER — BENZONATATE 100 MG PO CAPS
100.0000 mg | ORAL_CAPSULE | Freq: Three times a day (TID) | ORAL | 0 refills | Status: AC | PRN
  Filled 2024-01-23: qty 30, 10d supply, fill #0

## 2024-01-23 MED ORDER — PROMETHAZINE-DM 6.25-15 MG/5ML PO SYRP
5.0000 mL | ORAL_SOLUTION | Freq: Four times a day (QID) | ORAL | 0 refills | Status: DC | PRN
  Filled 2024-01-23: qty 118, 6d supply, fill #0

## 2024-01-23 MED ORDER — PREDNISONE 20 MG PO TABS
40.0000 mg | ORAL_TABLET | Freq: Every day | ORAL | 0 refills | Status: DC
  Filled 2024-01-23: qty 10, 5d supply, fill #0

## 2024-01-23 NOTE — Patient Instructions (Signed)
 Joshua Stuart, thank you for joining Elsie Velma Lunger, PA-C for today's virtual visit.  While this provider is not your primary care provider (PCP), if your PCP is located in our provider database this encounter information will be shared with them immediately following your visit.   A [facility] MyChart account gives you access to today's visit and all your visits, tests, and labs performed at [facility]  click here if you don't have a [facility] MyChart account or go to [email or web address]  Consent: (Patient) Joshua Stuart provided verbal consent for this virtual visit at the beginning of the encounter.  Current Medications:  Current Outpatient Medications:    benzonatate  (TESSALON ) 100 MG capsule, Take 1 capsule (100 mg total) by mouth 3 (three) times daily as needed for cough., Disp: 30 capsule, Rfl: 0   ipratropium (ATROVENT ) 0.03 % nasal spray, Place 2 sprays into both nostrils every 12 (twelve) hours., Disp: 30 mL, Rfl: 0   predniSONE  (DELTASONE ) 20 MG tablet, Take 2 tablets (40 mg total) by mouth daily with breakfast., Disp: 10 tablet, Rfl: 0   promethazine -dextromethorphan (PROMETHAZINE -DM) 6.25-15 MG/5ML syrup, Take 5 mLs by mouth 4 (four) times daily as needed for cough., Disp: 118 mL, Rfl: 0   Medications ordered in this encounter:  Meds ordered this encounter  Medications   promethazine -dextromethorphan (PROMETHAZINE -DM) 6.25-15 MG/5ML syrup    Sig: Take 5 mLs by mouth 4 (four) times daily as needed for cough.    Dispense:  118 mL    Refill:  0    Supervising Provider:   BLAISE ALEENE KIDD [id number]   benzonatate  (TESSALON ) 100 MG capsule    Sig: Take 1 capsule (100 mg total) by mouth 3 (three) times daily as needed for cough.    Dispense:  30 capsule    Refill:  0    Supervising Provider:   LAMPTEY, PHILIP O [[id number]]   ipratropium (ATROVENT ) 0.03 % nasal spray    Sig: Place 2 sprays into both nostrils every 12 (twelve) hours.    Dispense:   30 mL    Refill:  0    Supervising Provider:   LAMPTEY, PHILIP O [[id number]]   predniSONE  (DELTASONE ) 20 MG tablet    Sig: Take 2 tablets (40 mg total) by mouth daily with breakfast.    Dispense:  10 tablet    Refill:  0    Supervising Provider:   BLAISE ALEENE KIDD [[id number]]     *If you need refills on other medications prior to your next appointment, please contact your pharmacy*  Follow-Up: Call back or seek an in-person evaluation if the symptoms worsen or if the condition fails to improve as anticipated.  [facility] [phone]  Other Instructions Please hydrate and try to rest. If you have a humidifier, run it in your bedroom at night. Use saline nasal rinse to flush out your nasal passages. I have added on a nasal steroid spray to use once daily, as well as a cough syrup to use at nighttime and a cough pill to use during the daytime. Take the prednisone  as prescribed. If you note any non-resolving, new, or worsening symptoms despite treatment, please seek an in-person evaluation ASAP.    If you have been instructed to have an in-person evaluation today at a local Urgent Care facility, please use the link below. It will take you to a list of all of our available [facility], including address, phone number and hours of  operation. Please do not delay care.  [facility]  If you or a family member do not have a primary care provider, use the link below to schedule a visit and establish care. When you choose a [facility] primary care physician or advanced practice provider, you gain a long-term partner in health. Find a Primary Care Provider  Learn more about [facility]'s in-office and virtual care options: [facility] - Get Care Now

## 2024-01-23 NOTE — Progress Notes (Signed)
 Virtual Visit Consent   Joshua Stuart, you are scheduled for a virtual visit with a [facility] provider today. Just as with appointments in the office, your consent must be obtained to participate. Your consent will be active for this visit and any virtual visit you may have with one of our providers in the next 365 days. If you have a MyChart account, a copy of this consent can be sent to you electronically.  As this is a virtual visit, video technology does not allow for your provider to perform a traditional examination. This may limit your provider's ability to fully assess your condition. If your provider identifies any concerns that need to be evaluated in person or the need to arrange testing (such as labs, EKG, etc.), we will make arrangements to do so. Although advances in technology are sophisticated, we cannot ensure that it will always work on either your end or our end. If the connection with a video visit is poor, the visit may have to be switched to a telephone visit. With either a video or telephone visit, we are not always able to ensure that we have a secure connection.  By engaging in this virtual visit, you consent to the provision of healthcare and authorize for your insurance to be billed (if applicable) for the services provided during this visit. Depending on your insurance coverage, you may receive a charge related to this service.  I need to obtain your verbal consent now. Are you willing to proceed with your visit today? Joshua Stuart has provided verbal consent on 01/23/2024 for a virtual visit (video or telephone). Joshua Stuart, [place]  Date: 01/23/2024 12:50 PM   Virtual Visit via Video Note   I, Joshua Stuart, connected with  Joshua Stuart  ([id number], 11-23-1970) on 01/23/24 at 12:45 PM EST by a video-enabled telemedicine application and verified that I am speaking with the correct person using two identifiers.  Location: Patient: Virtual Visit  Location Patient: Home Provider: Virtual Visit Location Provider: Home Office   I discussed the limitations of evaluation and management by telemedicine and the availability of in person appointments. The patient expressed understanding and agreed to proceed.    History of Present Illness: Joshua Stuart is a 53 y.o. who identifies as a male who was assigned male at birth, and is being seen today for lingering congestion and cough after 9 days since onset. Noted initially with some mild aches and sore throat that quickly resolved, followed by a cough and chest congestion, nasal congestion and sinus congestion.  Notes the cough had been mostly dry but now occasionally productive of green phlegm, mainly in the mornings.  The cough is persistent and impacting his ability to sleep.  Notes some headache but denies any true sinus pain or dental pain.  Denies chest pain or shortness of breath.  Denies fever.  Has been trying over-the-counter mucolytic's and cough suppressants without much improvement.  Notes previously when this is happen he has required a short course of steroids to help the cough calm down.  HPI: HPI  Problems:  Patient Active Problem List   Diagnosis Date Noted   Chest pain    Syncope    Ureteral calculus 01/04/2017   Renal colic 01/04/2017    Allergies: No Known Allergies Medications:  Current Outpatient Medications:    benzonatate  (TESSALON ) 100 MG capsule, Take 1 capsule (100 mg total) by mouth 3 (three) times daily as needed for cough., Disp: 30  capsule, Rfl: 0   ipratropium (ATROVENT) 0.03 % nasal spray, Place 2 sprays into both nostrils every 12 (twelve) hours., Disp: 30 mL, Rfl: 0   predniSONE  (DELTASONE ) 20 MG tablet, Take 2 tablets (40 mg total) by mouth daily with breakfast., Disp: 10 tablet, Rfl: 0   promethazine-dextromethorphan (PROMETHAZINE-DM) 6.25-15 MG/5ML syrup, Take 5 mLs by mouth 4 (four) times daily as needed for cough., Disp: 118 mL, Rfl:  0  Observations/Objective: Patient is well-developed, well-nourished in no acute distress.  Resting comfortably  at home.  Head is normocephalic, atraumatic.  No labored breathing.  Speech is clear and coherent with logical content.  Patient is alert and oriented at baseline.   Assessment and Plan: 1. Viral bronchitis (Primary) - promethazine-dextromethorphan (PROMETHAZINE-DM) 6.25-15 MG/5ML syrup; Take 5 mLs by mouth 4 (four) times daily as needed for cough.  Dispense: 118 mL; Refill: 0 - benzonatate  (TESSALON ) 100 MG capsule; Take 1 capsule (100 mg total) by mouth 3 (three) times daily as needed for cough.  Dispense: 30 capsule; Refill: 0 - ipratropium (ATROVENT) 0.03 % nasal spray; Place 2 sprays into both nostrils every 12 (twelve) hours.  Dispense: 30 mL; Refill: 0 - predniSONE  (DELTASONE ) 20 MG tablet; Take 2 tablets (40 mg total) by mouth daily with breakfast.  Dispense: 10 tablet; Refill: 0  Supportive measures and OTC medications reviewed.  Will start Tessalon  and Promethazine DM for cough.  Will add on Atrovent nasal spray once daily.  Prednisone  burst as directed.  Follow-up precautions reviewed with patient.  Follow Up Instructions: I discussed the assessment and treatment plan with the patient. The patient was provided an opportunity to ask questions and all were answered. The patient agreed with the plan and demonstrated an understanding of the instructions.  A copy of instructions were sent to the patient via MyChart unless otherwise noted below.   The patient was advised to call back or seek an in-person evaluation if the symptoms worsen or if the condition fails to improve as anticipated.    Joshua Velma Lunger, PA-C

## 2024-01-30 ENCOUNTER — Ambulatory Visit
Admission: RE | Admit: 2024-01-30 | Discharge: 2024-01-30 | Disposition: A | Discharge status: Home / Self Care | Attending: Emergency Medicine

## 2024-01-30 ENCOUNTER — Other Ambulatory Visit: Payer: Self-pay

## 2024-01-30 VITALS — BP 119/81 | HR 79 | Temp 97.8°F | Resp 18

## 2024-01-30 DIAGNOSIS — J069 Acute upper respiratory infection, unspecified: Secondary | ICD-10-CM

## 2024-01-30 DIAGNOSIS — J0100 Acute maxillary sinusitis, unspecified: Secondary | ICD-10-CM | POA: Diagnosis not present

## 2024-01-30 MED ORDER — AMOXICILLIN-POT CLAVULANATE 875-125 MG PO TABS
1.0000 | ORAL_TABLET | Freq: Two times a day (BID) | ORAL | 0 refills | Status: AC
  Filled 2024-01-30: qty 20, 10d supply, fill #0

## 2024-01-30 MED ORDER — ALBUTEROL SULFATE HFA 108 (90 BASE) MCG/ACT IN AERS
1.0000 | INHALATION_SPRAY | Freq: Four times a day (QID) | RESPIRATORY_TRACT | 0 refills | Status: AC | PRN
  Filled 2024-01-30: qty 18, 30d supply, fill #0

## 2024-01-30 NOTE — ED Provider Notes (Signed)
 Joshua Stuart    CSN: [id number] Arrival date & time: 01/30/24  1256      History   Chief Complaint Chief Complaint  Patient presents with   Cough    Still sick after 16 days - Entered by patient    HPI Joshua Stuart is a 53 y.o. male.  Patient presents with 2-week history of congestion, postnasal drip, cough.  His cough is keeping him awake at night.  No fever, chest pain, shortness of breath, vomiting, diarrhea.  He reports no pertinent cardiovascular history.  Patient had a telehealth visit on 01/23/2024; diagnosed with viral bronchitis; treated with prednisone , ipratropium nasal spray, benzonatate , Promethazine  DM.  He has been taking these medications without relief.  The history is provided by the patient and medical records.    Past Medical History:  Diagnosis Date   Beta thalassemia minor    History of kidney stones    Thalassemia minor    Vasovagal syncope     Patient Active Problem List   Diagnosis Date Noted   Chest pain    Syncope    Ureteral calculus 01/04/2017   Renal colic 01/04/2017    Past Surgical History:  Procedure Laterality Date   COLONOSCOPY WITH PROPOFOL  N/A 12/30/2022   Procedure: COLONOSCOPY WITH PROPOFOL ;  Surgeon: Jinny Carmine, MD;  Location: [facility];  Service: Endoscopy;  Laterality: N/A;   EXTRACORPOREAL SHOCK WAVE LITHOTRIPSY Right 01/05/2017   Procedure: EXTRACORPOREAL SHOCK WAVE LITHOTRIPSY (ESWL);  Surgeon: Penne Knee, MD;  Location: [facility];  Service: Urology;  Laterality: Right;   INGUINAL HERNIA REPAIR Right 07/10/2020   Procedure: HERNIA REPAIR INGUINAL ADULT;  Surgeon: Dessa Reyes ORN, MD;  Location: [facility];  Service: General;  Laterality: Right;   POLYPECTOMY  12/30/2022   Procedure: POLYPECTOMY;  Surgeon: Jinny Carmine, MD;  Location: [facility];  Service: Endoscopy;;       Home Medications    Prior to Admission medications   Medication Sig Start Date End Date Taking? Authorizing  Provider  albuterol  (VENTOLIN  HFA) 108 (90 Base) MCG/ACT inhaler Inhale 1-2 puffs into the lungs every 6 (six) hours as needed. 01/30/24  Yes Corlis Burnard DEL, NP  amoxicillin -clavulanate (AUGMENTIN ) 875-125 MG tablet Take 1 tablet by mouth every 12 (twelve) hours for 10 days. 01/30/24 02/09/24 Yes Corlis Burnard DEL, NP  ipratropium (ATROVENT ) 0.03 % nasal spray Place 2 sprays into both nostrils every 12 (twelve) hours. 01/23/24  Yes Gladis Elsie BROCKS, PA-C  benzonatate  (TESSALON ) 100 MG capsule Take 1 capsule (100 mg total) by mouth 3 (three) times daily as needed for cough. 01/23/24   Gladis Elsie BROCKS, PA-C  predniSONE  (DELTASONE ) 20 MG tablet Take 2 tablets (40 mg total) by mouth daily with breakfast. Patient not taking: Reported on 01/30/2024 01/23/24   Gladis Elsie BROCKS, PA-C  promethazine -dextromethorphan (PROMETHAZINE -DM) 6.25-15 MG/5ML syrup Take 5 mLs by mouth 4 (four) times daily as needed for cough. 01/23/24   Gladis Elsie BROCKS, PA-C    Family History Family History  Problem Relation Age of Onset   Coronary artery disease Father        s/p PCI   Coronary artery disease Paternal Uncle     Social History Social History   Tobacco Use   Smoking status: Never   Smokeless tobacco: Never  Vaping Use   Vaping status: Never Used  Substance Use Topics   Alcohol use: No   Drug use: No     Allergies   Patient has no  known allergies.   Review of Systems Review of Systems  Constitutional:  Negative for chills and fever.  HENT:  Positive for congestion and postnasal drip. Negative for ear pain and sore throat.   Respiratory:  Positive for cough. Negative for shortness of breath.   Cardiovascular:  Negative for chest pain and palpitations.  Gastrointestinal:  Negative for diarrhea and vomiting.     Physical Exam Triage Vital Signs ED Triage Vitals  Encounter Vitals Group     BP 01/30/24 1318 119/81     Girls Systolic BP Percentile --      Girls Diastolic BP Percentile --      Boys  Systolic BP Percentile --      Boys Diastolic BP Percentile --      Pulse Rate 01/30/24 1318 79     Resp 01/30/24 1318 18     Temp 01/30/24 1318 97.8 F (36.6 C)     Temp src --      SpO2 01/30/24 1318 97 %     Weight --      Height --      Head Circumference --      Peak Flow --      Pain Score 01/30/24 1320 2     Pain Loc --      Pain Education --      Exclude from Growth Chart --    No data found.  Updated Vital Signs BP 119/81   Pulse 79   Temp 97.8 F (36.6 C)   Resp 18   SpO2 97%   Visual Acuity Right Eye Distance:   Left Eye Distance:   Bilateral Distance:    Right Eye Near:   Left Eye Near:    Bilateral Near:     Physical Exam Constitutional:      General: He is not in acute distress. HENT:     Right Ear: Tympanic membrane normal.     Left Ear: Tympanic membrane normal.     Nose: Nose normal.     Mouth/Throat:     Mouth: Mucous membranes are moist.     Pharynx: Oropharynx is clear.     Comments: PND Cardiovascular:     Rate and Rhythm: Normal rate and regular rhythm.     Heart sounds: Normal heart sounds.  Pulmonary:     Effort: Pulmonary effort is normal. No respiratory distress.     Breath sounds: Normal breath sounds.     Comments: Frequent cough Neurological:     Mental Status: He is alert.      UC Treatments / Results  Labs (all labs ordered are listed, but only abnormal results are displayed) Labs Reviewed - No data to display  EKG   Radiology No results found.  Procedures Procedures (including critical care time)  Medications Ordered in UC Medications - No data to display  Initial Impression / Assessment and Plan / UC Course  I have reviewed the triage vital signs and the nursing notes.  Pertinent labs & imaging results that were available during my care of the patient were reviewed by me and considered in my medical decision making (see chart for details).    Acute upper respiratory infection and sinusitis.  Afebrile  and vital signs are stable.  Lungs are clear at this time and O2 sat is 97% on room air.  Patient has been symptomatic for 2 weeks and is not improving.  He was recently prescribed prednisone  and is currently taking this along with  ipratropium nasal spray, Tessalon  Perles, Promethazine  DM.  Treating today with Augmentin  and albuterol  inhaler.  Education provided on sinus infection and URI.  Instructed patient to follow-up with a primary care provider if his symptoms are not improving.  He agrees to plan of care.  Final Clinical Impressions(s) / UC Diagnoses   Final diagnoses:  Acute upper respiratory infection  Acute non-recurrent maxillary sinusitis     Discharge Instructions      Take the Augmentin  as directed.  Use the albuterol  inhaler as directed.  Continue your current medications as prescribed.  Follow-up with your primary care if you are not improving.     ED Prescriptions     Medication Sig Dispense Auth. Provider   amoxicillin -clavulanate (AUGMENTIN ) 875-125 MG tablet Take 1 tablet by mouth every 12 (twelve) hours for 10 days. 20 tablet Corlis Burnard DEL, NP   albuterol  (VENTOLIN  HFA) 108 (90 Base) MCG/ACT inhaler Inhale 1-2 puffs into the lungs every 6 (six) hours as needed. 18 g Corlis Burnard DEL, NP      PDMP not reviewed this encounter.   Corlis Burnard DEL, NP 01/30/24 1346

## 2024-01-30 NOTE — Discharge Instructions (Addendum)
 Take the Augmentin  as directed.  Use the albuterol  inhaler as directed.  Continue your current medications as prescribed.  Follow-up with your primary care if you are not improving.

## 2024-01-30 NOTE — ED Triage Notes (Addendum)
 Patient to Urgent Care with complaints of cough/ continuous post nasal drip. Waking up during the night cough. Started with sore throat that has resolved. Symptoms x16 days.   Prescribed prednisone  (completed 2 days ago) via telehealth visit but still coughing.   Using atrovent  nasal spray/ prescribed cough medications PRN.

## 2024-02-06 ENCOUNTER — Other Ambulatory Visit: Payer: Self-pay

## 2024-02-06 ENCOUNTER — Encounter: Payer: Self-pay | Admitting: Pulmonary Disease

## 2024-02-06 ENCOUNTER — Ambulatory Visit: Admitting: Pulmonary Disease

## 2024-02-06 ENCOUNTER — Ambulatory Visit
Admission: RE | Admit: 2024-02-06 | Discharge: 2024-02-06 | Disposition: A | Attending: Pulmonary Disease | Admitting: Pulmonary Disease

## 2024-02-06 ENCOUNTER — Other Ambulatory Visit: Payer: Self-pay | Admitting: Pulmonary Disease

## 2024-02-06 ENCOUNTER — Ambulatory Visit
Admission: RE | Admit: 2024-02-06 | Discharge: 2024-02-06 | Disposition: A | Source: Ambulatory Visit | Attending: Pulmonary Disease | Admitting: Pulmonary Disease

## 2024-02-06 VITALS — BP 114/72 | HR 80 | Temp 97.6°F | Ht 73.0 in | Wt 205.6 lb

## 2024-02-06 DIAGNOSIS — R0602 Shortness of breath: Secondary | ICD-10-CM | POA: Diagnosis not present

## 2024-02-06 DIAGNOSIS — J40 Bronchitis, not specified as acute or chronic: Secondary | ICD-10-CM

## 2024-02-06 DIAGNOSIS — R052 Subacute cough: Secondary | ICD-10-CM | POA: Diagnosis not present

## 2024-02-06 DIAGNOSIS — R058 Other specified cough: Secondary | ICD-10-CM

## 2024-02-06 DIAGNOSIS — J208 Acute bronchitis due to other specified organisms: Secondary | ICD-10-CM

## 2024-02-06 DIAGNOSIS — R059 Cough, unspecified: Secondary | ICD-10-CM | POA: Diagnosis not present

## 2024-02-06 LAB — NITRIC OXIDE: Nitric Oxide: 18

## 2024-02-06 MED ORDER — METHYLPREDNISOLONE 4 MG PO TBPK
ORAL_TABLET | ORAL | 0 refills | Status: AC
  Filled 2024-02-06: qty 21, 6d supply, fill #0

## 2024-02-06 MED ORDER — IPRATROPIUM BROMIDE HFA 17 MCG/ACT IN AERS
2.0000 | INHALATION_SPRAY | RESPIRATORY_TRACT | 2 refills | Status: AC | PRN
  Filled 2024-02-06: qty 12.9, 30d supply, fill #0

## 2024-02-06 MED ORDER — AZITHROMYCIN 250 MG PO TABS
ORAL_TABLET | ORAL | 0 refills | Status: AC
  Filled 2024-02-06: qty 6, 5d supply, fill #0

## 2024-02-06 MED ORDER — PROMETHAZINE-DM 6.25-15 MG/5ML PO SYRP
5.0000 mL | ORAL_SOLUTION | Freq: Four times a day (QID) | ORAL | 0 refills | Status: AC | PRN
  Filled 2024-02-06: qty 180, 9d supply, fill #0

## 2024-02-06 NOTE — Patient Instructions (Signed)
 VISIT SUMMARY:  You came in today because of a persistent cough and sore throat that started three weeks ago. Initially, you had a severe sore throat which improved, but then you developed a persistent cough and post-nasal drip. You have been treated with prednisone , Atrovent  nasal spray, and Augmentin , but your symptoms have not fully resolved.  YOUR PLAN:  -POST VIRAL COUGH SYNDROME: Post viral cough syndrome is a persistent cough that can last up to eight weeks following a viral illness. Your cough has been ongoing for three weeks. We checked the nitric oxide  level in your airway to assess inflammation. Based on the results, we are recommending an extended prednisone  treatment. We also recommend using ipratropium to help manage your cough.  We have also added Azithromycin  which will add coverage for atypicals as well as reduce inflammation.  You may discontinue Augmentin .  -ACUTE UPPER AIRWAY INFLAMMATION: Acute upper airway inflammation is swelling in the airways that can cause wheezing and coughing. Your chest x-ray was normal, and you do not have symptoms of reflux. We checked the nitric oxide  level in your airway to assess inflammation.   INSTRUCTIONS:  Your nitric oxide  level test did not show significant T2 mediated inflammation however, other issues may add to inflammation.  We are recommending a Medrol  Dosepak, Azithromycin , ipratropium and continuation of Promethazine  DM as needed. If your symptoms worsen or do not improve, contact our office.

## 2024-02-06 NOTE — Progress Notes (Signed)
 Subjective:    Patient ID: Joshua Stuart, male    DOB: 30-Nov-1970, 53 y.o.   MRN: [id number]  Patient Care Team: Pcp, No as PCP - General  Chief Complaint  Patient presents with   Cough    BACKGROUND: Patient is a 53 year old lifelong never smoker who presents for ACUTE VISIT due to sore throat, cough and postnasal drip symptoms ongoing for approximately 3 weeks.   HPI Discussed the use of AI scribe software for clinical note transcription with the patient, who gave verbal consent to proceed.  History of Present Illness   Joshua Stuart is a 53 year old male who presents with sub-acute cough and sore throat.  His symptoms began approximately three weeks ago with a severe sore throat, but without fever or chills. The sore throat improved, but he developed a persistent sensation of post-nasal drip and a severe cough. The cough episodes last two to three minutes and occur frequently.    He sought care at an urgent care facility where he was prescribed a five-day course of prednisone  and Atrovent  nasal spray, which provided some initial relief. However, his symptoms recurred, including sinus congestion and production of brownish-gray sputum.  There has been no hemoptysis.  He returned to urgent care and was prescribed Augmentin , which he has been taking for seven days. Despite this, he continues to experience a persistent cough, particularly in the mornings, lasting three to five minutes. He describes the sensation of something being present in his throat that he cannot expel.  No symptoms of reflux. He has used albuterol  a few times but reports it did not provide significant relief.  He is a lifelong never smoker.  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   Past Medical History:  Diagnosis Date   Beta thalassemia minor    History of kidney stones     Thalassemia minor    Vasovagal syncope     Past Surgical History:  Procedure Laterality Date   COLONOSCOPY WITH PROPOFOL  N/A 12/30/2022   Procedure: COLONOSCOPY WITH PROPOFOL ;  Surgeon: Jinny Carmine, MD;  Location: [facility];  Service: Endoscopy;  Laterality: N/A;   EXTRACORPOREAL SHOCK WAVE LITHOTRIPSY Right 01/05/2017   Procedure: EXTRACORPOREAL SHOCK WAVE LITHOTRIPSY (ESWL);  Surgeon: Penne Knee, MD;  Location: [facility];  Service: Urology;  Laterality: Right;   INGUINAL HERNIA REPAIR Right 07/10/2020   Procedure: HERNIA REPAIR INGUINAL ADULT;  Surgeon: Dessa Reyes ORN, MD;  Location: [facility];  Service: General;  Laterality: Right;   POLYPECTOMY  12/30/2022   Procedure: POLYPECTOMY;  Surgeon: Jinny Carmine, MD;  Location: [facility];  Service: Endoscopy;;    Patient Active Problem List   Diagnosis Date Noted   Chest pain    Syncope    Ureteral calculus 01/04/2017   Renal colic 01/04/2017    Family History  Problem Relation Age of Onset   Coronary artery disease Father        s/p PCI   Coronary artery disease Paternal Uncle     Social History   Tobacco Use   Smoking status: Never   Smokeless tobacco: Never  Substance Use Topics   Alcohol use: No    Allergies[1]  Active Medications[2]   There is no immunization history on file for this patient.      Objective:     Vitals:  02/06/24 1311  BP: 114/72  Pulse: 80  Temp: 97.6 F (36.4 C)  Height: 6' 1 (1.854 m)  Weight: 205 lb 9.6 oz (93.3 kg)  SpO2: 98%  TempSrc: Temporal  BMI (Calculated): 27.13     SpO2: 98 %  GENERAL: Well-developed, well-nourished gentleman, no acute distress.  Fully ambulatory.  No conversational dyspnea HEAD: Normocephalic, atraumatic.  EYES: Pupils equal, round, reactive to light.  No scleral icterus.  MOUTH: Dentition intact.  Oral mucosa moist. NECK: Supple. No thyromegaly. Trachea midline. No JVD.  No adenopathy. PULMONARY: Good air entry  bilaterally.  Coarse lung sounds, occasional squeaks, no wheezes. CARDIOVASCULAR: S1 and S2. Regular rate and rhythm.  No rubs, murmurs or gallops heard. ABDOMEN: Benign. MUSCULOSKELETAL: No joint deformity, no clubbing, no edema.  NEUROLOGIC: No overt focal deficit, no gait disturbance, speech is fluent. SKIN: Intact,warm,dry. PSYCH: Mood and behavior normal.  Lab Results  Component Value Date   NITRICOXIDE 18 02/06/2024  This result suggests low (<25) Type 2 (T2) airway inflammation indicating a low likelihood of active T2-driven airway inflammation; reduced probability of response to inhaled corticosteroids.    Chest x-ray performed today shows no acute process, independently reviewed:    Assessment & Plan:     ICD-10-CM   1. Subacute cough  R05.2 Nitric oxide     2. Post-viral cough syndrome  R05.8 promethazine -dextromethorphan (PROMETHAZINE -DM) 6.25-15 MG/5ML syrup    3. Viral bronchitis  J20.8       Orders Placed This Encounter  Procedures   Nitric oxide     Meds ordered this encounter  Medications   methylPREDNISolone  (MEDROL  DOSEPAK) 4 MG TBPK tablet    Sig: Take as directed in the package.  This is a taper pack.    Dispense:  21 tablet    Refill:  0   azithromycin  (ZITHROMAX ) 250 MG tablet    Sig: Take 2 tablets (500 mg) on  Day 1,  followed by 1 tablet (250 mg) once daily on Days 2 through 5.    Dispense:  6 each    Refill:  0   ipratropium (ATROVENT  HFA) 17 MCG/ACT inhaler    Sig: Inhale 2 puffs into the lungs every 4 (four) hours as needed for wheezing (cough).    Dispense:  12.9 g    Refill:  2   promethazine -dextromethorphan (PROMETHAZINE -DM) 6.25-15 MG/5ML syrup    Sig: Take 5 mLs by mouth 4 (four) times daily as needed for cough.    Dispense:  180 mL    Refill:  0   Discussion:    Post viral cough syndrome Persistent cough for three weeks following a viral illness, characterized by episodes of coughing lasting two to three minutes. No fever or  chills. Previous treatment with prednisone  and Atrovent  nasal spray provided temporary relief. Current treatment with Augmentin  for seven days has not resolved symptoms. Likely post viral cough syndrome, which can last up to eight weeks regardless of treatment.  Albuterol  has been ineffective. Ipratropium may be more effective in this instance. - Checked nitric oxide  level in airway: Low no evidence of T2 inflammation - Medrol  Dosepak recommend - Recommended ipratropium for cough management - Refilled Phenergan  DM to use as needed  Acute lower airway inflammation/tracheobronchitis Inflammation in airways with wheezing. Chest x-ray normal. No reflux symptoms reported. - Checked nitric oxide  level in airway - Medrol  Dosepak - Azithromycin  for potential atypical coverage and anti-inflammatory effects     Follow-up as needed.  Advised if symptoms do not improve or worsen,  to please contact office for sooner follow up or seek emergency care.    I spent 35 minutes of dedicated to the care of this patient on the date of this encounter to include pre-visit review of records, face-to-face time with the patient discussing conditions above, post visit ordering of testing, clinical documentation with the electronic health record, making appropriate referrals as documented, and communicating necessary findings to members of the patients care team.   C. Leita Sanders, MD Advanced Bronchoscopy P[facility]    *This note was dictated using voice recognition software/Dragon.  Despite best efforts to proofread, errors can occur which can change the meaning. Any transcriptional errors that result from this process are unintentional and may not be fully corrected at the time of dictation.     [1] No Known Allergies [2]  Current Meds  Medication Sig   albuterol  (VENTOLIN  HFA) 108 (90 Base) MCG/ACT inhaler Inhale 1-2 puffs into the lungs every 6 (six) hours as needed.    amoxicillin -clavulanate (AUGMENTIN ) 875-125 MG tablet Take 1 tablet by mouth every 12 (twelve) hours for 10 days.   azithromycin  (ZITHROMAX ) 250 MG tablet Take 2 tablets (500 mg) on  Day 1,  followed by 1 tablet (250 mg) once daily on Days 2 through 5.   benzonatate  (TESSALON ) 100 MG capsule Take 1 capsule (100 mg total) by mouth 3 (three) times daily as needed for cough.   ipratropium (ATROVENT  HFA) 17 MCG/ACT inhaler Inhale 2 puffs into the lungs every 4 (four) hours as needed for wheezing (cough).   ipratropium (ATROVENT ) 0.03 % nasal spray Place 2 sprays into both nostrils every 12 (twelve) hours.   methylPREDNISolone  (MEDROL  DOSEPAK) 4 MG TBPK tablet Take as directed in the package.  This is a taper pack.   [DISCONTINUED] promethazine -dextromethorphan (PROMETHAZINE -DM) 6.25-15 MG/5ML syrup Take 5 mLs by mouth 4 (four) times daily as needed for cough.

## 2024-02-19 ENCOUNTER — Ambulatory Visit: Payer: Self-pay | Admitting: Pulmonary Disease
